# Patient Record
Sex: Female | Born: 1955 | Race: Black or African American | Hispanic: No | Marital: Married | Smoking: Never smoker
Health system: Southern US, Community
[De-identification: ages and names within clinical notes are randomized; demographics above are authoritative.]

## PROBLEM LIST (undated history)

## (undated) DIAGNOSIS — Z8489 Family history of other specified conditions: Secondary | ICD-10-CM

## (undated) DIAGNOSIS — E119 Type 2 diabetes mellitus without complications: Secondary | ICD-10-CM

## (undated) DIAGNOSIS — C3492 Malignant neoplasm of unspecified part of left bronchus or lung: Secondary | ICD-10-CM

## (undated) DIAGNOSIS — M1990 Unspecified osteoarthritis, unspecified site: Secondary | ICD-10-CM

## (undated) DIAGNOSIS — E876 Hypokalemia: Principal | ICD-10-CM

## (undated) DIAGNOSIS — Z973 Presence of spectacles and contact lenses: Secondary | ICD-10-CM

## (undated) HISTORY — DX: Malignant neoplasm of unspecified part of left bronchus or lung: C34.92

## (undated) HISTORY — PX: ABDOMINAL HYSTERECTOMY: SHX81

## (undated) HISTORY — DX: Hypokalemia: E87.6

---

## 2005-09-29 ENCOUNTER — Ambulatory Visit: Payer: Self-pay | Admitting: Family Medicine

## 2006-01-08 ENCOUNTER — Ambulatory Visit: Payer: Self-pay

## 2006-07-15 ENCOUNTER — Emergency Department: Payer: Self-pay | Admitting: Unknown Physician Specialty

## 2007-01-09 ENCOUNTER — Ambulatory Visit: Payer: Self-pay | Admitting: Obstetrics & Gynecology

## 2007-03-04 ENCOUNTER — Ambulatory Visit: Payer: Self-pay | Admitting: Family Medicine

## 2007-04-02 ENCOUNTER — Ambulatory Visit: Payer: Self-pay | Admitting: Family Medicine

## 2007-04-04 ENCOUNTER — Ambulatory Visit: Payer: Self-pay | Admitting: Specialist

## 2007-04-12 ENCOUNTER — Emergency Department: Payer: Self-pay | Admitting: Emergency Medicine

## 2008-04-21 ENCOUNTER — Ambulatory Visit: Payer: Self-pay | Admitting: Otolaryngology

## 2008-07-14 ENCOUNTER — Ambulatory Visit: Payer: Self-pay | Admitting: Family Medicine

## 2008-07-29 ENCOUNTER — Ambulatory Visit: Payer: Self-pay | Admitting: Internal Medicine

## 2008-07-30 ENCOUNTER — Ambulatory Visit: Payer: Self-pay | Admitting: Internal Medicine

## 2008-08-30 ENCOUNTER — Ambulatory Visit: Payer: Self-pay | Admitting: Internal Medicine

## 2010-10-26 ENCOUNTER — Ambulatory Visit: Payer: Self-pay | Admitting: Internal Medicine

## 2012-03-14 ENCOUNTER — Emergency Department: Payer: Self-pay | Admitting: *Deleted

## 2012-03-14 LAB — CBC
HCT: 38.6 % (ref 35.0–47.0)
MCH: 30 pg (ref 26.0–34.0)
MCHC: 34.1 g/dL (ref 32.0–36.0)
MCV: 88 fL (ref 80–100)
RBC: 4.39 10*6/uL (ref 3.80–5.20)

## 2012-03-14 LAB — BASIC METABOLIC PANEL
Anion Gap: 5 — ABNORMAL LOW (ref 7–16)
Co2: 30 mmol/L (ref 21–32)
EGFR (Non-African Amer.): >60
Osmolality: 280 (ref 275–301)

## 2013-05-27 ENCOUNTER — Other Ambulatory Visit: Payer: Self-pay | Admitting: Orthopedic Surgery

## 2013-06-03 ENCOUNTER — Encounter (HOSPITAL_BASED_OUTPATIENT_CLINIC_OR_DEPARTMENT_OTHER): Payer: Self-pay | Admitting: *Deleted

## 2013-06-03 NOTE — Progress Notes (Signed)
To come in for ekg and bmet-no cardiac problems-diabetic-sees a womens dr in [place]

## 2013-06-04 ENCOUNTER — Encounter (HOSPITAL_BASED_OUTPATIENT_CLINIC_OR_DEPARTMENT_OTHER)
Admission: RE | Admit: 2013-06-04 | Discharge: 2013-06-04 | Disposition: A | Discharge status: Home / Self Care | Payer: 59 | Source: Ambulatory Visit | Attending: Orthopedic Surgery | Admitting: Orthopedic Surgery

## 2013-06-04 ENCOUNTER — Other Ambulatory Visit: Payer: Self-pay

## 2013-06-04 LAB — BASIC METABOLIC PANEL
CO2: 28 mEq/L (ref 19–32)
Glucose, Bld: 304 mg/dL — ABNORMAL HIGH (ref 70–99)
Potassium: 4 mEq/L (ref 3.5–5.1)
Sodium: 135 mEq/L (ref 135–145)

## 2013-06-04 NOTE — H&P (Signed)
Terri Marks is an 57 y.o. female.   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  She has had a seven month history of swelling and pain in the radial aspect of her right wrist.  She had no antecedent history of directed trauma, but did have a strain injury when a dog took off and stretched her wrist.  She has had chronic pain unrelieved by activity modification and over-the-counter  analgesics.      Past Medical History  Diagnosis Date  . Diabetes mellitus without complication   . Wears glasses   . Arthritis   . Family history of anesthesia complication     sister hard to put under    Past Surgical History  Procedure Laterality Date  . Abdominal hysterectomy      partial  . Cesarean section      x2    History reviewed. No pertinent family history. Social History:  reports that she has never smoked. She does not have any smokeless tobacco history on file. She reports that she does not drink alcohol or use illicit drugs.  Allergies:  Allergies  Allergen Reactions  . Bactrim (Sulfamethoxazole W-Trimethoprim)     Gi upset  . Bactrim (Sulfamethoxazole-Tmp Ds)     Gi upset  . Flagyl (Metronidazole) Nausea And Vomiting  . Other     omnicef-diarrhia    No prescriptions prior to admission    Results for orders placed during the hospital encounter of 06/05/13 (from the past 48 hour(s))  BASIC METABOLIC PANEL     Status: Abnormal   Collection Time    06/04/13 10:00 AM      Result Value Range   Sodium 135  135 - 145 mEq/L   Potassium 4.0  3.5 - 5.1 mEq/L   Chloride 98  96 - 112 mEq/L   CO2 28  19 - 32 mEq/L   Glucose, Bld 304 (*) 70 - 99 mg/dL   BUN 14  6 - 23 mg/dL   Creatinine, Ser 1.61  0.50 - 1.10 mg/dL   Calcium 9.6  8.4 - 09.6 mg/dL   GFR calc non Af Amer 80 (*) >90 mL/min   GFR calc Af Amer >90  >90 mL/min   Comment:             The eGFR has been calculated     using the CKD EPI equation.     This calculation has not been     validated in all clinical     situations.     eGFR's persistently     <90 mL/min signify     possible Chronic Kidney Disease.    No results found.   Pertinent items are noted in HPI.  Height 5\' 2"  (1.575 m), weight 63.05 kg (139 lb).  General appearance: alert Head: Normocephalic, without obvious abnormality Neck: supple, symmetrical, trachea midline Resp: clear to auscultation bilaterally Cardio: regular rate and rhythm GI: normal findings: bowel sounds normal Extremities:  Inspection of her wrist reveals swelling at her right first dorsal compartment.  She is tender on palpation and has a positive Finkelstein maneuver. She has full range of motion of her fingers and thumb. Pulse and cap refill are intact.  Motor and sensory examination is unremarkable.    Pulses: 2+ and symmetric Skin: normal Neurologic: Grossly normal   Assessment/Plan Impression: DeQuervain's right wrist  Plan: To  the OR for release right 1st dorsal compartment.The procedure, risks,benefits and post-op course were discussed with the patient at length and they were in agreement with the plan.  DASNOIT,Doron Shake J 06/04/2013, 3:06 PM   H&P documentation: 06/05/2013  -History and Physical Reviewed  -Patient has been re-examined  -No change in the plan of care  Wyn Forster, MD

## 2013-06-05 ENCOUNTER — Encounter (HOSPITAL_BASED_OUTPATIENT_CLINIC_OR_DEPARTMENT_OTHER)
Admission: RE | Disposition: A | Discharge status: Home / Self Care | Payer: Self-pay | Source: Ambulatory Visit | Attending: Orthopedic Surgery

## 2013-06-05 ENCOUNTER — Ambulatory Visit (HOSPITAL_BASED_OUTPATIENT_CLINIC_OR_DEPARTMENT_OTHER)
Admission: RE | Admit: 2013-06-05 | Discharge: 2013-06-05 | Disposition: A | Discharge status: Home / Self Care | Payer: 59 | Source: Ambulatory Visit | Attending: Orthopedic Surgery | Admitting: Orthopedic Surgery

## 2013-06-05 ENCOUNTER — Encounter (HOSPITAL_BASED_OUTPATIENT_CLINIC_OR_DEPARTMENT_OTHER): Payer: Self-pay | Admitting: Anesthesiology

## 2013-06-05 ENCOUNTER — Ambulatory Visit (HOSPITAL_BASED_OUTPATIENT_CLINIC_OR_DEPARTMENT_OTHER): Payer: 59 | Admitting: Anesthesiology

## 2013-06-05 DIAGNOSIS — M719 Bursopathy, unspecified: Secondary | ICD-10-CM | POA: Insufficient documentation

## 2013-06-05 DIAGNOSIS — Z79899 Other long term (current) drug therapy: Secondary | ICD-10-CM | POA: Insufficient documentation

## 2013-06-05 DIAGNOSIS — M654 Radial styloid tenosynovitis [de Quervain]: Secondary | ICD-10-CM | POA: Insufficient documentation

## 2013-06-05 DIAGNOSIS — M6790 Unspecified disorder of synovium and tendon, unspecified site: Secondary | ICD-10-CM | POA: Insufficient documentation

## 2013-06-05 DIAGNOSIS — E119 Type 2 diabetes mellitus without complications: Secondary | ICD-10-CM | POA: Insufficient documentation

## 2013-06-05 HISTORY — DX: Unspecified osteoarthritis, unspecified site: M19.90

## 2013-06-05 HISTORY — DX: Family history of other specified conditions: Z84.89

## 2013-06-05 HISTORY — DX: Presence of spectacles and contact lenses: Z97.3

## 2013-06-05 HISTORY — PX: DORSAL COMPARTMENT RELEASE: SHX5039

## 2013-06-05 HISTORY — DX: Type 2 diabetes mellitus without complications: E11.9

## 2013-06-05 LAB — GLUCOSE, CAPILLARY: Glucose-Capillary: 230 mg/dL — ABNORMAL HIGH (ref 70–99)

## 2013-06-05 SURGERY — RELEASE, FIRST DORSAL COMPARTMENT, HAND
Anesthesia: Monitor Anesthesia Care | Site: Wrist | Laterality: Right | Wound class: Clean

## 2013-06-05 MED ORDER — ONDANSETRON HCL 4 MG/2ML IJ SOLN
INTRAMUSCULAR | Status: DC | PRN
  Administered 2013-06-05: 4 mg via INTRAVENOUS

## 2013-06-05 MED ORDER — PROPOFOL INFUSION 10 MG/ML OPTIME
INTRAVENOUS | Status: DC | PRN
  Administered 2013-06-05: 120 ug/kg/min via INTRAVENOUS

## 2013-06-05 MED ORDER — CHLORHEXIDINE GLUCONATE 4 % EX LIQD: 60.0000 mL | Freq: Once | CUTANEOUS | Status: DC

## 2013-06-05 MED ORDER — LIDOCAINE HCL (CARDIAC) 20 MG/ML IV SOLN
INTRAVENOUS | Status: DC | PRN
  Administered 2013-06-05: 50 mg via INTRAVENOUS

## 2013-06-05 MED ORDER — LACTATED RINGERS IV SOLN
INTRAVENOUS | Status: DC
  Administered 2013-06-05: 11:00:00 via INTRAVENOUS

## 2013-06-05 MED ORDER — OXYCODONE-ACETAMINOPHEN 5-325 MG PO TABS: ORAL_TABLET | ORAL | Status: DC

## 2013-06-05 MED ORDER — FENTANYL CITRATE 0.05 MG/ML IJ SOLN
INTRAMUSCULAR | Status: DC | PRN
  Administered 2013-06-05 (×2): 50 ug via INTRAVENOUS

## 2013-06-05 MED ORDER — LIDOCAINE HCL 2 % IJ SOLN
INTRAMUSCULAR | Status: DC | PRN
  Administered 2013-06-05: 2.5 mL

## 2013-06-05 MED ORDER — ONDANSETRON HCL 4 MG/2ML IJ SOLN: 4.0000 mg | Freq: Once | INTRAMUSCULAR | Status: DC | PRN

## 2013-06-05 MED ORDER — HYDROMORPHONE HCL PF 1 MG/ML IJ SOLN
0.2500 mg | INTRAMUSCULAR | Status: DC | PRN
Start: 2013-06-05 — End: 2013-06-05

## 2013-06-05 MED ORDER — FENTANYL CITRATE 0.05 MG/ML IJ SOLN: 50.0000 ug | INTRAMUSCULAR | Status: DC | PRN

## 2013-06-05 MED ORDER — MIDAZOLAM HCL 2 MG/2ML IJ SOLN: 1.0000 mg | INTRAMUSCULAR | Status: DC | PRN

## 2013-06-05 MED ORDER — MIDAZOLAM HCL 5 MG/5ML IJ SOLN
INTRAMUSCULAR | Status: DC | PRN
  Administered 2013-06-05 (×2): 1 mg via INTRAVENOUS

## 2013-06-05 MED ORDER — OXYCODONE HCL 5 MG/5ML PO SOLN: 5.0000 mg | Freq: Once | ORAL | Status: AC | PRN

## 2013-06-05 MED ORDER — OXYCODONE HCL 5 MG PO TABS
5.0000 mg | ORAL_TABLET | Freq: Once | ORAL | Status: AC | PRN
  Administered 2013-06-05: 5 mg via ORAL

## 2013-06-05 SURGICAL SUPPLY — 40 items
BANDAGE ELASTIC 3 VELCRO ST LF (GAUZE/BANDAGES/DRESSINGS) ×1 IMPLANT
BLADE MINI RND TIP GREEN BEAV (BLADE) IMPLANT
BLADE SURG 15 STRL LF DISP TIS (BLADE) ×1 IMPLANT
BLADE SURG 15 STRL SS (BLADE) ×2
BNDG CMPR 9X4 STRL LF SNTH (GAUZE/BANDAGES/DRESSINGS) ×1
BNDG ESMARK 4X9 LF (GAUZE/BANDAGES/DRESSINGS) ×1 IMPLANT
BRUSH SCRUB EZ PLAIN DRY (MISCELLANEOUS) ×2 IMPLANT
CLOTH BEACON ORANGE TIMEOUT ST (SAFETY) ×2 IMPLANT
CORDS BIPOLAR (ELECTRODE) IMPLANT
COVER MAYO STAND STRL (DRAPES) ×2 IMPLANT
COVER TABLE BACK 60X90 (DRAPES) ×2 IMPLANT
CUFF TOURNIQUET SINGLE 18IN (TOURNIQUET CUFF) ×1 IMPLANT
DECANTER SPIKE VIAL GLASS SM (MISCELLANEOUS) IMPLANT
DRAPE EXTREMITY T 121X128X90 (DRAPE) ×2 IMPLANT
DRAPE SURG 17X23 STRL (DRAPES) ×2 IMPLANT
DRSG TEGADERM 4X4.75 (GAUZE/BANDAGES/DRESSINGS) IMPLANT
GLOVE BIO SURGEON STRL SZ7 (GLOVE) ×1 IMPLANT
GLOVE BIOGEL M STRL SZ7.5 (GLOVE) ×2 IMPLANT
GLOVE BIOGEL PI IND STRL 7.5 (GLOVE) IMPLANT
GLOVE BIOGEL PI INDICATOR 7.5 (GLOVE) ×1
GLOVE ORTHO TXT STRL SZ7.5 (GLOVE) ×2 IMPLANT
GOWN BRE IMP PREV XXLGXLNG (GOWN DISPOSABLE) ×4 IMPLANT
GOWN PREVENTION PLUS XLARGE (GOWN DISPOSABLE) ×2 IMPLANT
NEEDLE 27GAX1X1/2 (NEEDLE) ×1 IMPLANT
PACK BASIN DAY SURGERY FS (CUSTOM PROCEDURE TRAY) ×2 IMPLANT
PAD CAST 3X4 CTTN HI CHSV (CAST SUPPLIES) IMPLANT
PADDING CAST ABS 4INX4YD NS (CAST SUPPLIES) ×1
PADDING CAST ABS COTTON 4X4 ST (CAST SUPPLIES) ×1 IMPLANT
PADDING CAST COTTON 3X4 STRL (CAST SUPPLIES)
SLEEVE SCD COMPRESS KNEE MED (MISCELLANEOUS) IMPLANT
SPONGE GAUZE 4X4 12PLY (GAUZE/BANDAGES/DRESSINGS) ×2 IMPLANT
STOCKINETTE 4X48 STRL (DRAPES) ×2 IMPLANT
STRIP CLOSURE SKIN 1/2X4 (GAUZE/BANDAGES/DRESSINGS) ×2 IMPLANT
SUT PROLENE 3 0 PS 2 (SUTURE) ×2 IMPLANT
SUT VIC AB 4-0 P-3 18XBRD (SUTURE) IMPLANT
SUT VIC AB 4-0 P3 18 (SUTURE)
SYR 3ML 23GX1 SAFETY (SYRINGE) IMPLANT
SYR CONTROL 10ML LL (SYRINGE) ×1 IMPLANT
TRAY DSU PREP LF (CUSTOM PROCEDURE TRAY) ×2 IMPLANT
UNDERPAD 30X30 INCONTINENT (UNDERPADS AND DIAPERS) ×2 IMPLANT

## 2013-06-05 NOTE — Transfer of Care (Signed)
Immediate Anesthesia Transfer of Care Note  Patient: Terri Marks  Procedure(s) Performed: Procedure(s) (LRB): RELEASE RIGHT FIRST DORSAL COMPARTMENT (DEQUERVAIN) (Right)  Patient Location: PACU  Anesthesia Type: MAC  Level of Consciousness: awake, sedated, patient cooperative and responds to stimulation  Airway & Oxygen Therapy: Patient Spontanous Breathing and Patient connected to face mask oxygen  Post-op Assessment: Report given to PACU RN, Post -op Vital signs reviewed and stable and Patient moving all extremities  Post vital signs: Reviewed and stable  Complications: No apparent anesthesia complications

## 2013-06-05 NOTE — Anesthesia Postprocedure Evaluation (Signed)
  Anesthesia Post-op Note  Patient: Terri Marks  Procedure(s) Performed: Procedure(s): RELEASE RIGHT FIRST DORSAL COMPARTMENT (DEQUERVAIN) (Right)  Patient Location: PACU  Anesthesia Type:General  Level of Consciousness: awake, alert  and oriented  Airway and Oxygen Therapy: Patient Spontanous Breathing  Post-op Pain: mild  Post-op Assessment: Post-op Vital signs reviewed  Post-op Vital Signs: Reviewed  Complications: No apparent anesthesia complications

## 2013-06-05 NOTE — Op Note (Signed)
[id number] 

## 2013-06-05 NOTE — Brief Op Note (Signed)
06/05/2013  11:23 AM  PATIENT:  Daylene Posey  57 y.o. female  PRE-OPERATIVE DIAGNOSIS:  RIGHT DEQUERVAIN SYNDROME  POST-OPERATIVE DIAGNOSIS:  RIGHT DEQUERVAIN SYNDROME  PROCEDURE:  Procedure(s): RELEASE RIGHT FIRST DORSAL COMPARTMENT (DEQUERVAIN) (Right)  SURGEON:  Surgeon(s) and Role:    * Wyn Forster., MD - Primary  PHYSICIAN ASSISTANT:   ASSISTANTS: Mallory Shirk.A-C   ANESTHESIA:   none  EBL:     BLOOD ADMINISTERED:none  DRAINS: none   LOCAL MEDICATIONS USED:  LIDOCAINE   SPECIMEN:  No Specimen  DISPOSITION OF SPECIMEN:  N/A  COUNTS:  YES  TOURNIQUET:    DICTATION: .Other Dictation: Dictation Number [id number]  PLAN OF CARE: Discharge to home after PACU  PATIENT DISPOSITION:  PACU - hemodynamically stable.   Delay start of Pharmacological VTE agent (>24hrs) due to surgical blood loss or risk of bleeding: not applicable

## 2013-06-05 NOTE — Anesthesia Preprocedure Evaluation (Addendum)
Anesthesia Evaluation  Patient identified by MRN, date of birth, ID band Patient awake    Reviewed: Allergy & Precautions, H&P , NPO status , Patient's Chart, lab work & pertinent test results  Airway Mallampati: I TM Distance: >3 FB Neck ROM: Full    Dental  (+) Teeth Intact and Dental Advisory Given   Pulmonary  breath sounds clear to auscultation        Cardiovascular Rhythm:Regular Rate:Normal     Neuro/Psych    GI/Hepatic   Endo/Other  diabetes, Poorly Controlled, Type 2, Oral Hypoglycemic Agents  Renal/GU      Musculoskeletal   Abdominal   Peds  Hematology   Anesthesia Other Findings   Reproductive/Obstetrics                           Anesthesia Physical Anesthesia Plan  ASA: III  Anesthesia Plan: MAC   Post-op Pain Management:    Induction: Intravenous  Airway Management Planned: Simple Face Mask  Additional Equipment:   Intra-op Plan:   Post-operative Plan:   Informed Consent: I have reviewed the patients History and Physical, chart, labs and discussed the procedure including the risks, benefits and alternatives for the proposed anesthesia with the patient or authorized representative who has indicated his/her understanding and acceptance.   Dental advisory given  Plan Discussed with: CRNA and Anesthesiologist  Anesthesia Plan Comments:         Anesthesia Quick Evaluation

## 2013-06-06 ENCOUNTER — Encounter (HOSPITAL_BASED_OUTPATIENT_CLINIC_OR_DEPARTMENT_OTHER): Payer: Self-pay | Admitting: Orthopedic Surgery

## 2013-06-06 NOTE — Op Note (Signed)
Terri, Marks               ACCOUNT NO.:  [id number]  MEDICAL RECORD NO.:  [id number]  LOCATION:                               FACILITY:  [facility]  PHYSICIAN:  Katy Fitch. Lothar Prehn, M.D. 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  OPERATIONS:  Release of first dorsal compartment, right wrist, and resection of septum in compartment.  OPERATING SURGEON:  Katy Fitch. Dayana Dalporto, MD  ASSISTANT:  Marveen Reeks Dasnoit, PA  ANESTHESIA:  2% lidocaine, radial superficial branch block and first dorsal compartment block, right wrist, supplemented by IV sedation.  SUPERVISING ANESTHESIOLOGIST:  Sheldon Silvan, M.D.  INDICATIONS:  Terri Marks is a 57 year old woman, employed as a Chief Strategy Officer at [employer].  She has a history of 8 months of pain on the radial aspect of her right wrist.  She has not responded to splinting and activity modification.  She has diabetes.  She had a very large bump at the first dorsal compartment.  We recommended release of first dorsal compartment.  Preoperatively, she was noted to have radial superficial sensory branch symptoms.  After informed consent, she was brought to the operating room at this time.  DESCRIPTION OF PROCEDURE:  Terri Marks was interviewed by Dr. Ivin Booty of Anesthesia and was advised to consider sedation and local anesthesia.  Questions regarding her anticipated right wrist surgery were invited and answered in detail followed by marking of her right wrist as the proper surgical site with a marking pen per the identification protocol.  She was transferred to room 6 of the [facility] where under Dr.  Ivin Booty' direct supervision, IV sedation was provided.  The right hand and wrist were then prepped with Betadine soap and solution and sterilely draped.  A pneumatic tourniquet was applied to the proximal right brachium.  A 2% lidocaine was infiltrated along the radial aspect of the wrist proximal to the first dorsal compartment, creating a block of the radial superficial sensory branches and the compartment was blocked directly with an injection.  After 5 minutes, excellent anesthesia was achieved.  Following exsanguination of the right arm with an Esmarch bandage, an arterial tourniquet on the proximal brachium was inflated at 220 mmHg.  Following routine surgical time-out, a short transverse incision was fashioned, exposing the first dorsal compartment region.  The compartment was invested in inflammatory tissue.  The most dorsal radial superficial sensory branch was adherent and obscured by the inflammatory tissue.  This placed this branch at significant risk for surgical injury.  This was gently dissected with tenotomy scissors and a micro Freer.  It was then gently retracted.  The compartment had a significant wall thickening of 3 mm.  It was released at its apex, identifying a single slip of the abductor pollicis longus.  There was a very large slip of the extensor pollicis brevis which upon traction extended the MP and interphalangeal joint.  A septum was noted  between the abductor pollicis longus tendon slip and the extensor pollicis brevis.  The septum was incised and subsequently removed with a rongeur.  Thereafter, free range of motion of the CMC, MP, and IP joints was noted.  The wound was inspected for bleeding points followed by repair of the skin with intradermal 3-0 Prolene and Steri-Strips.  For aftercare, Terri Marks was provided prescription for Percocet 5 mg 1 p.o. q.4 to 6 hours p.r.n. pain, 20 tabs, without refill.  We will see her back in followup in our  office for recheck in 1 week.     Katy Fitch Mihcael Ledee, M.D.     RVS/MEDQ  D:  06/05/2013  T:  06/06/2013  Job:  [id number]  cc:   Estell Harpin, M.D.

## 2014-01-16 ENCOUNTER — Ambulatory Visit: Payer: Self-pay

## 2014-01-28 ENCOUNTER — Ambulatory Visit: Payer: Self-pay

## 2014-02-27 ENCOUNTER — Ambulatory Visit: Payer: Self-pay

## 2014-03-26 ENCOUNTER — Ambulatory Visit: Payer: Self-pay | Admitting: Family Medicine

## 2014-03-26 ENCOUNTER — Ambulatory Visit: Payer: Self-pay | Admitting: Internal Medicine

## 2014-03-31 ENCOUNTER — Ambulatory Visit: Payer: Self-pay | Admitting: Internal Medicine

## 2014-03-31 LAB — COMPREHENSIVE METABOLIC PANEL
ALBUMIN: 3.7 g/dL (ref 3.4–5.0)
ANION GAP: 7 (ref 7–16)
Alkaline Phosphatase: 125 U/L — ABNORMAL HIGH
BILIRUBIN TOTAL: 0.3 mg/dL (ref 0.2–1.0)
BUN: 10 mg/dL (ref 7–18)
CALCIUM: 9.1 mg/dL (ref 8.5–10.1)
CHLORIDE: 103 mmol/L (ref 98–107)
CO2: 32 mmol/L (ref 21–32)
CREATININE: 0.81 mg/dL (ref 0.60–1.30)
EGFR (Non-African Amer.): >60
GLUCOSE: 165 mg/dL — AB (ref 65–99)
OSMOLALITY: 286 (ref 275–301)
Potassium: 3.6 mmol/L (ref 3.5–5.1)
SGOT(AST): 6 U/L — ABNORMAL LOW (ref 15–37)
SGPT (ALT): 14 U/L (ref 12–78)
Sodium: 142 mmol/L (ref 136–145)
Total Protein: 7.9 g/dL (ref 6.4–8.2)

## 2014-03-31 LAB — CBC CANCER CENTER
BASOS ABS: 0 x10 3/mm (ref 0.0–0.1)
BASOS PCT: 0.4 %
Eosinophil #: 0.1 x10 3/mm (ref 0.0–0.7)
Eosinophil %: 2.1 %
HCT: 40.1 % (ref 35.0–47.0)
HGB: 13.3 g/dL (ref 12.0–16.0)
LYMPHS ABS: 1.4 x10 3/mm (ref 1.0–3.6)
Lymphocyte %: 36.6 %
MCH: 28.7 pg (ref 26.0–34.0)
MCHC: 33.1 g/dL (ref 32.0–36.0)
MCV: 87 fL (ref 80–100)
Monocyte #: 0.3 x10 3/mm (ref 0.2–0.9)
Monocyte %: 8.3 %
Neutrophil #: 2 x10 3/mm (ref 1.4–6.5)
Neutrophil %: 52.6 %
Platelet: 208 x10 3/mm (ref 150–440)
RBC: 4.62 10*6/uL (ref 3.80–5.20)
RDW: 12.8 % (ref 11.5–14.5)
WBC: 3.8 x10 3/mm (ref 3.6–11.0)

## 2014-03-31 LAB — APTT: ACTIVATED PTT: 28.6 s (ref 23.6–35.9)

## 2014-03-31 LAB — PROTIME-INR
INR: 0.9
Prothrombin Time: 12.1 secs (ref 11.5–14.7)

## 2014-04-02 LAB — PROT IMMUNOELECTROPHORES(ARMC)

## 2014-04-08 LAB — URINE IEP, RANDOM

## 2014-04-16 ENCOUNTER — Ambulatory Visit: Payer: Self-pay | Admitting: Internal Medicine

## 2014-04-24 LAB — CREATININE, SERUM
Creatinine: 0.97 mg/dL (ref 0.60–1.30)
EGFR (African American): >60
EGFR (Non-African Amer.): >60

## 2014-04-24 LAB — CALCIUM: Calcium, Total: 9.5 mg/dL (ref 8.5–10.1)

## 2014-04-27 ENCOUNTER — Ambulatory Visit: Payer: Self-pay | Admitting: Internal Medicine

## 2014-04-29 ENCOUNTER — Ambulatory Visit: Payer: Self-pay | Admitting: Internal Medicine

## 2014-05-12 LAB — CBC CANCER CENTER
BASOS ABS: 0 x10 3/mm (ref 0.0–0.1)
BASOS PCT: 0.1 %
EOS PCT: 0 %
Eosinophil #: 0 x10 3/mm (ref 0.0–0.7)
HCT: 39 % (ref 35.0–47.0)
HGB: 13.1 g/dL (ref 12.0–16.0)
LYMPHS ABS: 0.6 x10 3/mm — AB (ref 1.0–3.6)
Lymphocyte %: 7 %
MCH: 28.7 pg (ref 26.0–34.0)
MCHC: 33.5 g/dL (ref 32.0–36.0)
MCV: 86 fL (ref 80–100)
MONOS PCT: 0.8 %
Monocyte #: 0.1 x10 3/mm — ABNORMAL LOW (ref 0.2–0.9)
NEUTROS ABS: 7.5 x10 3/mm — AB (ref 1.4–6.5)
NEUTROS PCT: 92.1 %
Platelet: 262 x10 3/mm (ref 150–440)
RBC: 4.55 10*6/uL (ref 3.80–5.20)
RDW: 13.3 % (ref 11.5–14.5)
WBC: 8.1 x10 3/mm (ref 3.6–11.0)

## 2014-05-12 LAB — BASIC METABOLIC PANEL
Anion Gap: 13 (ref 7–16)
BUN: 16 mg/dL (ref 7–18)
CALCIUM: 9.6 mg/dL (ref 8.5–10.1)
CO2: 26 mmol/L (ref 21–32)
Chloride: 99 mmol/L (ref 98–107)
Creatinine: 1.01 mg/dL (ref 0.60–1.30)
Glucose: 358 mg/dL — ABNORMAL HIGH (ref 65–99)
Osmolality: 291 (ref 275–301)
POTASSIUM: 4.3 mmol/L (ref 3.5–5.1)
Sodium: 138 mmol/L (ref 136–145)

## 2014-05-19 LAB — CBC CANCER CENTER
BASOS ABS: 0.1 x10 3/mm (ref 0.0–0.1)
BASOS PCT: 0.4 %
EOS PCT: 0.6 %
Eosinophil #: 0.1 x10 3/mm (ref 0.0–0.7)
HCT: 44.5 % (ref 35.0–47.0)
HGB: 14.4 g/dL (ref 12.0–16.0)
Lymphocyte #: 2.5 x10 3/mm (ref 1.0–3.6)
Lymphocyte %: 18 %
MCH: 27.9 pg (ref 26.0–34.0)
MCHC: 32.4 g/dL (ref 32.0–36.0)
MCV: 86 fL (ref 80–100)
Monocyte #: 1.3 x10 3/mm — ABNORMAL HIGH (ref 0.2–0.9)
Monocyte %: 9.2 %
Neutrophil #: 9.8 x10 3/mm — ABNORMAL HIGH (ref 1.4–6.5)
Neutrophil %: 71.8 %
Platelet: 196 x10 3/mm (ref 150–440)
RBC: 5.18 10*6/uL (ref 3.80–5.20)
RDW: 12.6 % (ref 11.5–14.5)
WBC: 13.7 x10 3/mm — ABNORMAL HIGH (ref 3.6–11.0)

## 2014-05-22 LAB — PATHOLOGY REPORT

## 2014-05-26 LAB — HEPATIC FUNCTION PANEL A (ARMC)
ALK PHOS: 149 U/L — AB
Albumin: 3.4 g/dL (ref 3.4–5.0)
BILIRUBIN TOTAL: 0.2 mg/dL (ref 0.2–1.0)
Bilirubin, Direct: <0.1 mg/dL (ref 0.00–0.20)
SGOT(AST): 11 U/L — ABNORMAL LOW (ref 15–37)
SGPT (ALT): 21 U/L
Total Protein: 7.1 g/dL (ref 6.4–8.2)

## 2014-05-26 LAB — CBC CANCER CENTER
BASOS ABS: 0.1 x10 3/mm (ref 0.0–0.1)
Basophil %: 0.5 %
EOS ABS: 0.1 x10 3/mm (ref 0.0–0.7)
Eosinophil %: 1.2 %
HCT: 38.8 % (ref 35.0–47.0)
HGB: 12.9 g/dL (ref 12.0–16.0)
LYMPHS ABS: 1.8 x10 3/mm (ref 1.0–3.6)
LYMPHS PCT: 18.1 %
MCH: 28.5 pg (ref 26.0–34.0)
MCHC: 33.3 g/dL (ref 32.0–36.0)
MCV: 86 fL (ref 80–100)
MONO ABS: 0.7 x10 3/mm (ref 0.2–0.9)
MONOS PCT: 6.5 %
Neutrophil #: 7.4 x10 3/mm — ABNORMAL HIGH (ref 1.4–6.5)
Neutrophil %: 73.7 %
Platelet: 146 x10 3/mm — ABNORMAL LOW (ref 150–440)
RBC: 4.53 10*6/uL (ref 3.80–5.20)
RDW: 12.7 % (ref 11.5–14.5)
WBC: 10.1 x10 3/mm (ref 3.6–11.0)

## 2014-05-26 LAB — CREATININE, SERUM
Creatinine: 0.93 mg/dL (ref 0.60–1.30)
EGFR (African American): >60

## 2014-05-28 ENCOUNTER — Ambulatory Visit: Payer: Self-pay | Admitting: Vascular Surgery

## 2014-05-30 ENCOUNTER — Ambulatory Visit: Payer: Self-pay | Admitting: Internal Medicine

## 2014-06-02 LAB — BASIC METABOLIC PANEL
Anion Gap: 9 (ref 7–16)
BUN: 16 mg/dL (ref 7–18)
CALCIUM: 9.1 mg/dL (ref 8.5–10.1)
CO2: 25 mmol/L (ref 21–32)
Chloride: 102 mmol/L (ref 98–107)
Creatinine: 0.9 mg/dL (ref 0.60–1.30)
EGFR (African American): >60
Glucose: 415 mg/dL — ABNORMAL HIGH (ref 65–99)
OSMOLALITY: 291 (ref 275–301)
Potassium: 4.4 mmol/L (ref 3.5–5.1)
SODIUM: 136 mmol/L (ref 136–145)

## 2014-06-02 LAB — CBC CANCER CENTER
BASOS ABS: 0 x10 3/mm (ref 0.0–0.1)
BASOS PCT: 0.2 %
EOS PCT: 0.1 %
Eosinophil #: 0 x10 3/mm (ref 0.0–0.7)
HCT: 37.7 % (ref 35.0–47.0)
HGB: 12.5 g/dL (ref 12.0–16.0)
LYMPHS PCT: 9.8 %
Lymphocyte #: 0.8 x10 3/mm — ABNORMAL LOW (ref 1.0–3.6)
MCH: 28.8 pg (ref 26.0–34.0)
MCHC: 33.1 g/dL (ref 32.0–36.0)
MCV: 87 fL (ref 80–100)
MONO ABS: 0.1 x10 3/mm — AB (ref 0.2–0.9)
MONOS PCT: 0.9 %
Neutrophil #: 7.6 x10 3/mm — ABNORMAL HIGH (ref 1.4–6.5)
Neutrophil %: 89 %
PLATELETS: 240 x10 3/mm (ref 150–440)
RBC: 4.34 10*6/uL (ref 3.80–5.20)
RDW: 13.6 % (ref 11.5–14.5)
WBC: 8.5 x10 3/mm (ref 3.6–11.0)

## 2014-06-09 LAB — CBC CANCER CENTER
Basophil #: 0.1 "x10 3/mm "
Basophil %: 0.6 %
Eosinophil #: 0.1 "x10 3/mm "
Eosinophil %: 1.1 %
HCT: 38.5 %
HGB: 12.8 g/dL
Lymphocyte %: 17.3 %
Lymphs Abs: 2.2 "x10 3/mm "
MCH: 28.7 pg
MCHC: 33.2 g/dL
MCV: 87 fL
Monocyte #: 0.7 "x10 3/mm "
Monocyte %: 5.6 %
Neutrophil #: 9.4 "x10 3/mm " — ABNORMAL HIGH
Neutrophil %: 75.4 %
Platelet: 119 "x10 3/mm " — ABNORMAL LOW
RBC: 4.46 "x10 6/mm "
RDW: 13.5 %
WBC: 12.5 "x10 3/mm " — ABNORMAL HIGH

## 2014-06-09 LAB — CREATININE, SERUM
CREATININE: 1.15 mg/dL (ref 0.60–1.30)
EGFR (African American): >60
GFR CALC NON AF AMER: 53 — AB

## 2014-06-16 LAB — CBC CANCER CENTER
BASOS ABS: 0 x10 3/mm (ref 0.0–0.1)
BASOS PCT: 0.4 %
Eosinophil #: 0.2 x10 3/mm (ref 0.0–0.7)
Eosinophil %: 1.7 %
HCT: 36 % (ref 35.0–47.0)
HGB: 12 g/dL (ref 12.0–16.0)
LYMPHS ABS: 2 x10 3/mm (ref 1.0–3.6)
Lymphocyte %: 22.5 %
MCH: 28.8 pg (ref 26.0–34.0)
MCHC: 33.3 g/dL (ref 32.0–36.0)
MCV: 87 fL (ref 80–100)
MONO ABS: 0.5 x10 3/mm (ref 0.2–0.9)
Monocyte %: 5.2 %
NEUTROS ABS: 6.3 x10 3/mm (ref 1.4–6.5)
NEUTROS PCT: 70.2 %
PLATELETS: 111 x10 3/mm — AB (ref 150–440)
RBC: 4.16 10*6/uL (ref 3.80–5.20)
RDW: 14.3 % (ref 11.5–14.5)
WBC: 8.9 x10 3/mm (ref 3.6–11.0)

## 2014-06-23 LAB — CBC CANCER CENTER
BASOS ABS: 0 x10 3/mm (ref 0.0–0.1)
Basophil %: 0.5 %
Eosinophil #: 0 x10 3/mm (ref 0.0–0.7)
Eosinophil %: 0.7 %
HCT: 35.2 % (ref 35.0–47.0)
HGB: 11.6 g/dL — AB (ref 12.0–16.0)
LYMPHS ABS: 1.2 x10 3/mm (ref 1.0–3.6)
Lymphocyte %: 29.6 %
MCH: 28.9 pg (ref 26.0–34.0)
MCHC: 32.9 g/dL (ref 32.0–36.0)
MCV: 88 fL (ref 80–100)
MONOS PCT: 5.1 %
Monocyte #: 0.2 x10 3/mm (ref 0.2–0.9)
NEUTROS PCT: 64.1 %
Neutrophil #: 2.7 x10 3/mm (ref 1.4–6.5)
Platelet: 182 x10 3/mm (ref 150–440)
RBC: 4 10*6/uL (ref 3.80–5.20)
RDW: 15.7 % — AB (ref 11.5–14.5)
WBC: 4.2 x10 3/mm (ref 3.6–11.0)

## 2014-06-23 LAB — HEPATIC FUNCTION PANEL A (ARMC)
ALBUMIN: 3.6 g/dL (ref 3.4–5.0)
ALK PHOS: 103 U/L
AST: 10 U/L — AB (ref 15–37)
BILIRUBIN TOTAL: 0.5 mg/dL (ref 0.2–1.0)
Bilirubin, Direct: 0.1 mg/dL (ref 0.00–0.20)
SGPT (ALT): 18 U/L
TOTAL PROTEIN: 7.2 g/dL (ref 6.4–8.2)

## 2014-06-23 LAB — BASIC METABOLIC PANEL
ANION GAP: 9 (ref 7–16)
BUN: 7 mg/dL (ref 7–18)
CHLORIDE: 104 mmol/L (ref 98–107)
CO2: 27 mmol/L (ref 21–32)
Calcium, Total: 8.7 mg/dL (ref 8.5–10.1)
Creatinine: 0.86 mg/dL (ref 0.60–1.30)
GLUCOSE: 216 mg/dL — AB (ref 65–99)
Osmolality: 284 (ref 275–301)
POTASSIUM: 3.9 mmol/L (ref 3.5–5.1)
Sodium: 140 mmol/L (ref 136–145)

## 2014-06-30 ENCOUNTER — Ambulatory Visit: Payer: Self-pay | Admitting: Internal Medicine

## 2014-06-30 LAB — CBC CANCER CENTER
BASOS ABS: 0 x10 3/mm (ref 0.0–0.1)
Basophil %: 0.5 %
EOS ABS: 0 x10 3/mm (ref 0.0–0.7)
Eosinophil %: 0.4 %
HCT: 38.4 % (ref 35.0–47.0)
HGB: 12.8 g/dL (ref 12.0–16.0)
LYMPHS ABS: 1.6 x10 3/mm (ref 1.0–3.6)
Lymphocyte %: 25.7 %
MCH: 29.3 pg (ref 26.0–34.0)
MCHC: 33.3 g/dL (ref 32.0–36.0)
MCV: 88 fL (ref 80–100)
Monocyte #: 0.4 x10 3/mm (ref 0.2–0.9)
Monocyte %: 6.4 %
NEUTROS ABS: 4.2 x10 3/mm (ref 1.4–6.5)
Neutrophil %: 67 %
PLATELETS: 132 x10 3/mm — AB (ref 150–440)
RBC: 4.36 10*6/uL (ref 3.80–5.20)
RDW: 15.5 % — AB (ref 11.5–14.5)
WBC: 6.3 x10 3/mm (ref 3.6–11.0)

## 2014-06-30 LAB — CREATININE, SERUM
CREATININE: 1.13 mg/dL (ref 0.60–1.30)
GFR CALC NON AF AMER: 54 — AB

## 2014-07-07 LAB — CBC CANCER CENTER
Basophil #: 0 x10 3/mm (ref 0.0–0.1)
Basophil %: 0.3 %
EOS ABS: 0.1 x10 3/mm (ref 0.0–0.7)
Eosinophil %: 0.8 %
HCT: 36.5 % (ref 35.0–47.0)
HGB: 12 g/dL (ref 12.0–16.0)
Lymphocyte #: 1.9 x10 3/mm (ref 1.0–3.6)
Lymphocyte %: 24.2 %
MCH: 29.4 pg (ref 26.0–34.0)
MCHC: 33 g/dL (ref 32.0–36.0)
MCV: 89 fL (ref 80–100)
MONO ABS: 0.5 x10 3/mm (ref 0.2–0.9)
MONOS PCT: 6.5 %
NEUTROS ABS: 5.3 x10 3/mm (ref 1.4–6.5)
Neutrophil %: 68.2 %
PLATELETS: 163 x10 3/mm (ref 150–440)
RBC: 4.09 10*6/uL (ref 3.80–5.20)
RDW: 16.6 % — ABNORMAL HIGH (ref 11.5–14.5)
WBC: 7.7 x10 3/mm (ref 3.6–11.0)

## 2014-07-14 LAB — HEPATIC FUNCTION PANEL A (ARMC)
ALK PHOS: 94 U/L
ALT: 35 U/L
Albumin: 3.8 g/dL (ref 3.4–5.0)
BILIRUBIN DIRECT: 0.1 mg/dL (ref 0.00–0.20)
BILIRUBIN TOTAL: 0.6 mg/dL (ref 0.2–1.0)
SGOT(AST): 25 U/L (ref 15–37)
TOTAL PROTEIN: 7.6 g/dL (ref 6.4–8.2)

## 2014-07-14 LAB — BASIC METABOLIC PANEL
ANION GAP: 10 (ref 7–16)
BUN: 8 mg/dL (ref 7–18)
CO2: 25 mmol/L (ref 21–32)
CREATININE: 0.9 mg/dL (ref 0.60–1.30)
Calcium, Total: 9.5 mg/dL (ref 8.5–10.1)
Chloride: 102 mmol/L (ref 98–107)
GLUCOSE: 303 mg/dL — AB (ref 65–99)
OSMOLALITY: 284 (ref 275–301)
Potassium: 4.1 mmol/L (ref 3.5–5.1)
SODIUM: 137 mmol/L (ref 136–145)

## 2014-07-14 LAB — CBC CANCER CENTER
BASOS ABS: 0 x10 3/mm (ref 0.0–0.1)
BASOS PCT: 0.2 %
EOS PCT: 0 %
Eosinophil #: 0 x10 3/mm (ref 0.0–0.7)
HCT: 36.5 % (ref 35.0–47.0)
HGB: 12.1 g/dL (ref 12.0–16.0)
LYMPHS ABS: 0.6 x10 3/mm — AB (ref 1.0–3.6)
LYMPHS PCT: 12.8 %
MCH: 29.6 pg (ref 26.0–34.0)
MCHC: 33 g/dL (ref 32.0–36.0)
MCV: 90 fL (ref 80–100)
MONO ABS: 0.1 x10 3/mm — AB (ref 0.2–0.9)
Monocyte %: 1.8 %
NEUTROS ABS: 3.8 x10 3/mm (ref 1.4–6.5)
Neutrophil %: 85.2 %
Platelet: 210 x10 3/mm (ref 150–440)
RBC: 4.08 10*6/uL (ref 3.80–5.20)
RDW: 17.3 % — AB (ref 11.5–14.5)
WBC: 4.4 x10 3/mm (ref 3.6–11.0)

## 2014-07-21 LAB — CBC CANCER CENTER
Basophil #: 0 x10 3/mm (ref 0.0–0.1)
Basophil %: 0.3 %
EOS ABS: 0 x10 3/mm (ref 0.0–0.7)
EOS PCT: 0 %
HCT: 37.8 % (ref 35.0–47.0)
HGB: 12.3 g/dL (ref 12.0–16.0)
Lymphocyte #: 0.4 x10 3/mm — ABNORMAL LOW (ref 1.0–3.6)
Lymphocyte %: 9.2 %
MCH: 30.1 pg (ref 26.0–34.0)
MCHC: 32.6 g/dL (ref 32.0–36.0)
MCV: 92 fL (ref 80–100)
Monocyte #: 0 x10 3/mm — ABNORMAL LOW (ref 0.2–0.9)
Monocyte %: 0.7 %
NEUTROS PCT: 89.8 %
Neutrophil #: 4.1 x10 3/mm (ref 1.4–6.5)
Platelet: 238 x10 3/mm (ref 150–440)
RBC: 4.1 10*6/uL (ref 3.80–5.20)
RDW: 17.9 % — ABNORMAL HIGH (ref 11.5–14.5)
WBC: 4.5 x10 3/mm (ref 3.6–11.0)

## 2014-07-21 LAB — BASIC METABOLIC PANEL
Anion Gap: 12 (ref 7–16)
BUN: 10 mg/dL (ref 7–18)
CHLORIDE: 96 mmol/L — AB (ref 98–107)
CREATININE: 1.15 mg/dL (ref 0.60–1.30)
Calcium, Total: 9.3 mg/dL (ref 8.5–10.1)
Co2: 24 mmol/L (ref 21–32)
EGFR (Non-African Amer.): 53 — ABNORMAL LOW
GLUCOSE: 476 mg/dL — AB (ref 65–99)
Osmolality: 285 (ref 275–301)
Potassium: 4.7 mmol/L (ref 3.5–5.1)
SODIUM: 132 mmol/L — AB (ref 136–145)

## 2014-07-28 LAB — CBC CANCER CENTER
BASOS ABS: 0 x10 3/mm (ref 0.0–0.1)
Basophil %: 0.6 %
Eosinophil #: 0.1 x10 3/mm (ref 0.0–0.7)
Eosinophil %: 1.4 %
HCT: 36.2 % (ref 35.0–47.0)
HGB: 12.1 g/dL (ref 12.0–16.0)
LYMPHS PCT: 33.9 %
Lymphocyte #: 1.9 x10 3/mm (ref 1.0–3.6)
MCH: 30.4 pg (ref 26.0–34.0)
MCHC: 33.4 g/dL (ref 32.0–36.0)
MCV: 91 fL (ref 80–100)
MONO ABS: 0.3 x10 3/mm (ref 0.2–0.9)
MONOS PCT: 4.8 %
Neutrophil #: 3.3 x10 3/mm (ref 1.4–6.5)
Neutrophil %: 59.3 %
Platelet: 242 x10 3/mm (ref 150–440)
RBC: 3.97 10*6/uL (ref 3.80–5.20)
RDW: 16.9 % — AB (ref 11.5–14.5)
WBC: 5.5 x10 3/mm (ref 3.6–11.0)

## 2014-07-28 LAB — BASIC METABOLIC PANEL
ANION GAP: 11 (ref 7–16)
BUN: 9 mg/dL (ref 7–18)
CALCIUM: 9 mg/dL (ref 8.5–10.1)
CHLORIDE: 101 mmol/L (ref 98–107)
Co2: 26 mmol/L (ref 21–32)
Creatinine: 0.88 mg/dL (ref 0.60–1.30)
EGFR (African American): >60
EGFR (Non-African Amer.): >60
Glucose: 257 mg/dL — ABNORMAL HIGH (ref 65–99)
OSMOLALITY: 283 (ref 275–301)
Potassium: 4.1 mmol/L (ref 3.5–5.1)
Sodium: 138 mmol/L (ref 136–145)

## 2014-07-30 ENCOUNTER — Ambulatory Visit: Payer: Self-pay | Admitting: Internal Medicine

## 2014-08-03 ENCOUNTER — Ambulatory Visit: Payer: Self-pay | Admitting: Gastroenterology

## 2014-08-04 LAB — BASIC METABOLIC PANEL
Anion Gap: 11 (ref 7–16)
BUN: 7 mg/dL (ref 7–18)
CALCIUM: 9.4 mg/dL (ref 8.5–10.1)
CHLORIDE: 105 mmol/L (ref 98–107)
Co2: 24 mmol/L (ref 21–32)
Creatinine: 0.71 mg/dL (ref 0.60–1.30)
EGFR (African American): >60
EGFR (Non-African Amer.): >60
Glucose: 188 mg/dL — ABNORMAL HIGH (ref 65–99)
Osmolality: 282 (ref 275–301)
Potassium: 3.9 mmol/L (ref 3.5–5.1)
SODIUM: 140 mmol/L (ref 136–145)

## 2014-08-04 LAB — CBC CANCER CENTER
BASOS ABS: 0 x10 3/mm (ref 0.0–0.1)
BASOS PCT: 0.5 %
EOS PCT: 1.5 %
Eosinophil #: 0 x10 3/mm (ref 0.0–0.7)
HCT: 32.5 % — ABNORMAL LOW (ref 35.0–47.0)
HGB: 10.5 g/dL — ABNORMAL LOW (ref 12.0–16.0)
LYMPHS ABS: 1.5 x10 3/mm (ref 1.0–3.6)
Lymphocyte %: 51.1 %
MCH: 30.3 pg (ref 26.0–34.0)
MCHC: 32.4 g/dL (ref 32.0–36.0)
MCV: 94 fL (ref 80–100)
MONO ABS: 0.2 x10 3/mm (ref 0.2–0.9)
MONOS PCT: 5.8 %
NEUTROS PCT: 41.1 %
Neutrophil #: 1.2 x10 3/mm — ABNORMAL LOW (ref 1.4–6.5)
PLATELETS: 218 x10 3/mm (ref 150–440)
RBC: 3.46 10*6/uL — AB (ref 3.80–5.20)
RDW: 16.9 % — AB (ref 11.5–14.5)
WBC: 2.9 x10 3/mm — AB (ref 3.6–11.0)

## 2014-08-05 LAB — PATHOLOGY REPORT

## 2014-08-18 LAB — CBC CANCER CENTER
BASOS ABS: 0 x10 3/mm (ref 0.0–0.1)
BASOS PCT: 0.2 %
EOS ABS: 0 x10 3/mm (ref 0.0–0.7)
Eosinophil %: 0.7 %
HCT: 38 % (ref 35.0–47.0)
HGB: 12.4 g/dL (ref 12.0–16.0)
LYMPHS PCT: 46.1 %
Lymphocyte #: 1.2 x10 3/mm (ref 1.0–3.6)
MCH: 31.2 pg (ref 26.0–34.0)
MCHC: 32.5 g/dL (ref 32.0–36.0)
MCV: 96 fL (ref 80–100)
MONO ABS: 0.3 x10 3/mm (ref 0.2–0.9)
MONOS PCT: 12.8 %
NEUTROS ABS: 1.1 x10 3/mm — AB (ref 1.4–6.5)
NEUTROS PCT: 40.2 %
PLATELETS: 165 x10 3/mm (ref 150–440)
RBC: 3.96 10*6/uL (ref 3.80–5.20)
RDW: 15.6 % — ABNORMAL HIGH (ref 11.5–14.5)
WBC: 2.7 x10 3/mm — ABNORMAL LOW (ref 3.6–11.0)

## 2014-08-30 ENCOUNTER — Ambulatory Visit: Payer: Self-pay | Admitting: Internal Medicine

## 2014-09-02 LAB — HEPATIC FUNCTION PANEL A (ARMC)
ALBUMIN: 3.8 g/dL (ref 3.4–5.0)
ALT: 13 U/L — AB
Alkaline Phosphatase: 77 U/L
BILIRUBIN DIRECT: 0.1 mg/dL (ref 0.0–0.2)
Bilirubin,Total: 0.2 mg/dL (ref 0.2–1.0)
SGOT(AST): 16 U/L (ref 15–37)
Total Protein: 7.4 g/dL (ref 6.4–8.2)

## 2014-09-02 LAB — CBC CANCER CENTER
BASOS ABS: 0 x10 3/mm (ref 0.0–0.1)
Basophil %: 0.3 %
Eosinophil #: 0.1 x10 3/mm (ref 0.0–0.7)
Eosinophil %: 1.3 %
HCT: 36.6 % (ref 35.0–47.0)
HGB: 12.1 g/dL (ref 12.0–16.0)
LYMPHS ABS: 1.1 x10 3/mm (ref 1.0–3.6)
LYMPHS PCT: 24.8 %
MCH: 31.3 pg (ref 26.0–34.0)
MCHC: 32.9 g/dL (ref 32.0–36.0)
MCV: 95 fL (ref 80–100)
MONO ABS: 0.4 x10 3/mm (ref 0.2–0.9)
Monocyte %: 8.5 %
NEUTROS ABS: 2.8 x10 3/mm (ref 1.4–6.5)
Neutrophil %: 65.1 %
PLATELETS: 186 x10 3/mm (ref 150–440)
RBC: 3.85 10*6/uL (ref 3.80–5.20)
RDW: 14 % (ref 11.5–14.5)
WBC: 4.3 x10 3/mm (ref 3.6–11.0)

## 2014-09-02 LAB — BASIC METABOLIC PANEL
Anion Gap: 8 (ref 7–16)
BUN: 11 mg/dL (ref 7–18)
CHLORIDE: 104 mmol/L (ref 98–107)
CO2: 27 mmol/L (ref 21–32)
Calcium, Total: 9.3 mg/dL (ref 8.5–10.1)
Creatinine: 0.72 mg/dL (ref 0.60–1.30)
EGFR (African American): >60
GLUCOSE: 112 mg/dL — AB (ref 65–99)
OSMOLALITY: 278 (ref 275–301)
POTASSIUM: 3.9 mmol/L (ref 3.5–5.1)
Sodium: 139 mmol/L (ref 136–145)

## 2014-09-29 ENCOUNTER — Ambulatory Visit: Payer: Self-pay | Admitting: Internal Medicine

## 2014-09-30 LAB — CBC CANCER CENTER
BASOS PCT: 0.3 %
Basophil #: 0 x10 3/mm (ref 0.0–0.1)
EOS ABS: 0 x10 3/mm (ref 0.0–0.7)
EOS PCT: 1 %
HCT: 38.9 % (ref 35.0–47.0)
HGB: 12.9 g/dL (ref 12.0–16.0)
Lymphocyte #: 0.9 x10 3/mm — ABNORMAL LOW (ref 1.0–3.6)
Lymphocyte %: 25.7 %
MCH: 30.7 pg (ref 26.0–34.0)
MCHC: 33 g/dL (ref 32.0–36.0)
MCV: 93 fL (ref 80–100)
MONO ABS: 0.4 x10 3/mm (ref 0.2–0.9)
MONOS PCT: 10.5 %
Neutrophil #: 2.1 x10 3/mm (ref 1.4–6.5)
Neutrophil %: 62.5 %
PLATELETS: 208 x10 3/mm (ref 150–440)
RBC: 4.18 10*6/uL (ref 3.80–5.20)
RDW: 13.6 % (ref 11.5–14.5)
WBC: 3.4 x10 3/mm — AB (ref 3.6–11.0)

## 2014-09-30 LAB — HEPATIC FUNCTION PANEL A (ARMC)
ALK PHOS: 93 U/L
ALT: 25 U/L
AST: 18 U/L (ref 15–37)
Albumin: 3.6 g/dL (ref 3.4–5.0)
BILIRUBIN DIRECT: 0.1 mg/dL (ref 0.0–0.2)
Bilirubin,Total: 0.2 mg/dL (ref 0.2–1.0)
TOTAL PROTEIN: 7.3 g/dL (ref 6.4–8.2)

## 2014-09-30 LAB — BASIC METABOLIC PANEL
ANION GAP: 8 (ref 7–16)
BUN: 12 mg/dL (ref 7–18)
CO2: 30 mmol/L (ref 21–32)
Calcium, Total: 9.4 mg/dL (ref 8.5–10.1)
Chloride: 105 mmol/L (ref 98–107)
Creatinine: 0.9 mg/dL (ref 0.60–1.30)
EGFR (Non-African Amer.): >60
Glucose: 115 mg/dL — ABNORMAL HIGH (ref 65–99)
OSMOLALITY: 286 (ref 275–301)
Potassium: 4.5 mmol/L (ref 3.5–5.1)
SODIUM: 143 mmol/L (ref 136–145)

## 2014-10-14 LAB — HEPATIC FUNCTION PANEL A (ARMC)
ALK PHOS: 95 U/L
AST: 15 U/L (ref 15–37)
Albumin: 3.4 g/dL (ref 3.4–5.0)
BILIRUBIN TOTAL: 0.2 mg/dL (ref 0.2–1.0)
SGPT (ALT): 27 U/L
Total Protein: 6.7 g/dL (ref 6.4–8.2)

## 2014-10-14 LAB — PHOSPHORUS: PHOSPHORUS: 3.2 mg/dL (ref 2.5–4.9)

## 2014-10-14 LAB — CBC CANCER CENTER
BASOS ABS: 0 x10 3/mm (ref 0.0–0.1)
BASOS PCT: 0.3 %
Eosinophil #: 0.1 x10 3/mm (ref 0.0–0.7)
Eosinophil %: 2.1 %
HCT: 35.6 % (ref 35.0–47.0)
HGB: 11.8 g/dL — AB (ref 12.0–16.0)
LYMPHS ABS: 1.3 x10 3/mm (ref 1.0–3.6)
LYMPHS PCT: 36.4 %
MCH: 30.2 pg (ref 26.0–34.0)
MCHC: 33 g/dL (ref 32.0–36.0)
MCV: 92 fL (ref 80–100)
MONOS PCT: 8.9 %
Monocyte #: 0.3 x10 3/mm (ref 0.2–0.9)
Neutrophil #: 1.8 x10 3/mm (ref 1.4–6.5)
Neutrophil %: 52.3 %
Platelet: 191 x10 3/mm (ref 150–440)
RBC: 3.89 10*6/uL (ref 3.80–5.20)
RDW: 13.8 % (ref 11.5–14.5)
WBC: 3.5 x10 3/mm — ABNORMAL LOW (ref 3.6–11.0)

## 2014-10-14 LAB — BASIC METABOLIC PANEL
ANION GAP: 9 (ref 7–16)
BUN: 16 mg/dL (ref 7–18)
CALCIUM: 8.9 mg/dL (ref 8.5–10.1)
CHLORIDE: 105 mmol/L (ref 98–107)
CREATININE: 0.94 mg/dL (ref 0.60–1.30)
Co2: 28 mmol/L (ref 21–32)
EGFR (African American): >60
GLUCOSE: 170 mg/dL — AB (ref 65–99)
Osmolality: 288 (ref 275–301)
Potassium: 3.7 mmol/L (ref 3.5–5.1)
Sodium: 142 mmol/L (ref 136–145)

## 2014-10-14 LAB — MAGNESIUM: MAGNESIUM: 1.5 mg/dL — AB

## 2014-10-30 ENCOUNTER — Ambulatory Visit: Payer: Self-pay | Admitting: Internal Medicine

## 2014-11-04 LAB — CBC CANCER CENTER
Basophil #: 0 x10 3/mm (ref 0.0–0.1)
Basophil %: 0.3 %
EOS ABS: 0.1 x10 3/mm (ref 0.0–0.7)
Eosinophil %: 1.2 %
HCT: 35.6 % (ref 35.0–47.0)
HGB: 11.8 g/dL — ABNORMAL LOW (ref 12.0–16.0)
LYMPHS PCT: 31.6 %
Lymphocyte #: 1.3 x10 3/mm (ref 1.0–3.6)
MCH: 30.1 pg (ref 26.0–34.0)
MCHC: 33.2 g/dL (ref 32.0–36.0)
MCV: 91 fL (ref 80–100)
MONOS PCT: 10.1 %
Monocyte #: 0.4 x10 3/mm (ref 0.2–0.9)
NEUTROS ABS: 2.3 x10 3/mm (ref 1.4–6.5)
NEUTROS PCT: 56.8 %
PLATELETS: 187 x10 3/mm (ref 150–440)
RBC: 3.93 10*6/uL (ref 3.80–5.20)
RDW: 13.6 % (ref 11.5–14.5)
WBC: 4 x10 3/mm (ref 3.6–11.0)

## 2014-11-04 LAB — BASIC METABOLIC PANEL
Anion Gap: 7 (ref 7–16)
BUN: 16 mg/dL (ref 7–18)
CALCIUM: 8.6 mg/dL (ref 8.5–10.1)
CREATININE: 0.86 mg/dL (ref 0.60–1.30)
Chloride: 105 mmol/L (ref 98–107)
Co2: 30 mmol/L (ref 21–32)
EGFR (African American): >60
GLUCOSE: 134 mg/dL — AB (ref 65–99)
Osmolality: 286 (ref 275–301)
Potassium: 4 mmol/L (ref 3.5–5.1)
SODIUM: 142 mmol/L (ref 136–145)

## 2014-11-04 LAB — MAGNESIUM: Magnesium: 1.8 mg/dL

## 2014-11-04 LAB — HEPATIC FUNCTION PANEL A (ARMC)
Albumin: 3.3 g/dL — ABNORMAL LOW (ref 3.4–5.0)
Alkaline Phosphatase: 85 U/L
BILIRUBIN DIRECT: 0.1 mg/dL (ref 0.0–0.2)
BILIRUBIN TOTAL: 0.2 mg/dL (ref 0.2–1.0)
SGOT(AST): 13 U/L — ABNORMAL LOW (ref 15–37)
SGPT (ALT): 23 U/L
Total Protein: 6.7 g/dL (ref 6.4–8.2)

## 2014-11-04 LAB — PHOSPHORUS: PHOSPHORUS: 3.4 mg/dL (ref 2.5–4.9)

## 2014-11-25 LAB — CBC CANCER CENTER
BASOS PCT: 0.4 %
Basophil #: 0 x10 3/mm (ref 0.0–0.1)
Eosinophil #: 0.1 x10 3/mm (ref 0.0–0.7)
Eosinophil %: 1.6 %
HCT: 39.7 % (ref 35.0–47.0)
HGB: 13.1 g/dL (ref 12.0–16.0)
LYMPHS ABS: 1.2 x10 3/mm (ref 1.0–3.6)
Lymphocyte %: 32.7 %
MCH: 29.7 pg (ref 26.0–34.0)
MCHC: 32.9 g/dL (ref 32.0–36.0)
MCV: 90 fL (ref 80–100)
MONO ABS: 0.3 x10 3/mm (ref 0.2–0.9)
MONOS PCT: 9.4 %
NEUTROS PCT: 55.9 %
Neutrophil #: 2 x10 3/mm (ref 1.4–6.5)
Platelet: 205 x10 3/mm (ref 150–440)
RBC: 4.4 10*6/uL (ref 3.80–5.20)
RDW: 14.1 % (ref 11.5–14.5)
WBC: 3.5 x10 3/mm — AB (ref 3.6–11.0)

## 2014-11-25 LAB — BASIC METABOLIC PANEL
ANION GAP: 11 (ref 7–16)
BUN: 18 mg/dL (ref 7–18)
CREATININE: 0.89 mg/dL (ref 0.60–1.30)
Calcium, Total: 8.7 mg/dL (ref 8.5–10.1)
Chloride: 102 mmol/L (ref 98–107)
Co2: 28 mmol/L (ref 21–32)
EGFR (African American): >60
GLUCOSE: 188 mg/dL — AB (ref 65–99)
Osmolality: 288 (ref 275–301)
Potassium: 4.1 mmol/L (ref 3.5–5.1)
Sodium: 141 mmol/L (ref 136–145)

## 2014-11-25 LAB — HEPATIC FUNCTION PANEL A (ARMC)
Albumin: 3.5 g/dL (ref 3.4–5.0)
Alkaline Phosphatase: 91 U/L (ref 46–116)
Bilirubin,Total: 0.2 mg/dL (ref 0.2–1.0)
SGOT(AST): 17 U/L (ref 15–37)
SGPT (ALT): 23 U/L (ref 14–63)
Total Protein: 7.1 g/dL (ref 6.4–8.2)

## 2014-11-25 LAB — PHOSPHORUS: PHOSPHORUS: 3.8 mg/dL (ref 2.5–4.9)

## 2014-11-30 ENCOUNTER — Ambulatory Visit: Payer: Self-pay | Admitting: Internal Medicine

## 2014-12-29 ENCOUNTER — Ambulatory Visit
Admit: 2014-12-29 | Disposition: A | Discharge status: Home / Self Care | Payer: Self-pay | Attending: Internal Medicine | Admitting: Internal Medicine

## 2015-01-07 ENCOUNTER — Ambulatory Visit: Payer: Self-pay | Admitting: Internal Medicine

## 2015-01-29 ENCOUNTER — Ambulatory Visit
Admit: 2015-01-29 | Disposition: A | Discharge status: Home / Self Care | Payer: Self-pay | Attending: Internal Medicine | Admitting: Internal Medicine

## 2015-02-08 LAB — CBC CANCER CENTER
BASOS ABS: 0 x10 3/mm (ref 0.0–0.1)
Basophil %: 0.3 %
EOS ABS: 0.1 x10 3/mm (ref 0.0–0.7)
Eosinophil %: 2.2 %
HCT: 37.5 % (ref 35.0–47.0)
HGB: 12.8 g/dL (ref 12.0–16.0)
LYMPHS ABS: 0.9 x10 3/mm — AB (ref 1.0–3.6)
Lymphocyte %: 31.5 %
MCH: 30.2 pg (ref 26.0–34.0)
MCHC: 34 g/dL (ref 32.0–36.0)
MCV: 89 fL (ref 80–100)
MONO ABS: 0.2 x10 3/mm (ref 0.2–0.9)
Monocyte %: 8.7 %
NEUTROS ABS: 1.6 x10 3/mm (ref 1.4–6.5)
Neutrophil %: 57.3 %
Platelet: 155 x10 3/mm (ref 150–440)
RBC: 4.22 10*6/uL (ref 3.80–5.20)
RDW: 13.3 % (ref 11.5–14.5)
WBC: 2.8 x10 3/mm — ABNORMAL LOW (ref 3.6–11.0)

## 2015-02-08 LAB — HEPATIC FUNCTION PANEL A (ARMC)
ALBUMIN: 3.7 g/dL
ALK PHOS: 66 U/L
ALT: 12 U/L — AB
AST: 12 U/L — AB
BILIRUBIN TOTAL: 0.7 mg/dL
Bilirubin, Direct: 0.1 mg/dL
Indirect Bilirubin: 0.6
TOTAL PROTEIN: 6.8 g/dL

## 2015-02-08 LAB — CREATININE, SERUM
CREATININE: 0.82 mg/dL
EGFR (Non-African Amer.): >60

## 2015-02-08 LAB — PHOSPHORUS: Phosphorus: 2.7 mg/dL

## 2015-02-20 NOTE — Op Note (Signed)
PATIENT NAME:  Terri Marks, SCHOLLE MR#:  [id number] DATE OF BIRTH:  29-May-1956  DATE OF PROCEDURE:  05/28/2014  PREOPERATIVE DIAGNOSIS:  Lung cancer with poor venous access.   POSTOPERATIVE DIAGNOSIS:  Lung cancer with poor venous access.   PROCEDURES:  1.  Ultrasound guidance for vascular access, right internal jugular vein.  2.  Fluoroscopic guidance for placement of catheter.  3.  Placement of CT compatible Port-A-Cath, right internal jugular vein.   SURGEON:   Algernon Huxley, MD.  ANESTHESIA:  Local with moderate conscious sedation.   FLUOROSCOPY TIME:  Less than 1 minute.   CONTRAST:  Zero.   ESTIMATED BLOOD LOSS:  Minimal.  INDICATION FOR PROCEDURE:  A 59 year old [place] American female with lung cancer who needs a Port-A-Cath for chemotherapy and durable venous access. Risks and benefits were discussed. Informed consent was obtained.   DESCRIPTION OF THE PROCEDURE:  The patient was brought to the vascular and interventional radiology suite. The right neck and chest were sterilely prepped and draped, and a sterile surgical field was created. Ultrasound was used to help visualize a patent right internal jugular vein. This was then accessed under direct ultrasound guidance without difficulty with a Seldinger needle and a permanent image was recorded. A J-wire was placed. After skin nick and dilatation, the peel-away sheath was then placed over the wire. I then anesthetized an area under the clavicle approximately 2 fingerbreadths. A transverse incision was created and an inferior pocket was created with electrocautery and blunt dissection. The port was then brought onto the field, placed into the pocket and secured to the chest wall with 2 Prolene sutures. The catheter was connected to the port and tunneled from the subclavicular incision to the access site. Fluoroscopic guidance was used to cut the catheter to an appropriate length. The catheter was then placed through the peel-away sheath and  the peel-away sheath was removed. The catheter tip was parked in excellent location in the cavoatrial junction. The pocket was then irrigated with antibiotic-impregnated saline and the wound was closed with a running 3-0 Vicryl and a 4-0 Monocryl. The access incision was closed with a single 4-0 Monocryl. The Huber needle was used to withdraw blood and flush the port with heparinized saline. Dermabond was then placed as a dressing. The patient tolerated the procedure well and was taken to the recovery room in stable condition.     ____________________________ Algernon Huxley, MD jsd:ts D: 05/28/2014 11:37:40 ET T: 05/28/2014 12:06:14 ET JOB#: [id number]  cc: Algernon Huxley, MD, <Dictator> Algernon Huxley MD ELECTRONICALLY SIGNED 06/09/2014 14:11

## 2015-02-20 NOTE — Consult Note (Signed)
Reason for Visit: This 59 year old Female Terri Marks presents to the clinic for initial evaluation of  left orbit metastasis .   Referred by Dr Ma Hillock.  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 Left lung biopsy 04/16/2014 was positive for adenocarcinoma acinar and micropapillary patterns. Morphologically as this was consistent with lung primary. She was started by medical oncology on Taxol carboplatin and Avastin and Zometa for bone metastasis. She's been having increasing distortion of her vision especially from the left orbit ophthalmology eye examination showed a 4 disc diameter choridallesion consistent with metastatic disease. Initial MRI of the orbits do not demonstrate this although recent MRI scan of her orbits to demonstrate a subtle irregular thickening of the posterior left orbit. She also has an eye examination posterior detachment of the vitreous on the right orbit and bilateral cataracts. Her pain is under control at this time. I been asked to evaluate the Terri Marks for possible palliative radiation therapy to her left orbit. She states her vision is intermittent and fuzzy does have difficulty reading at this time  Past Hx:    Arthritis:    Diabetes:    Denies medical history:    C-Section:    Hysterectomy - Total:   Past, Family and Social History:  Past Medical History positive   Cardiovascular hyperlipidemia   Endocrine diabetes mellitus   Past Surgical History C-section,  hysterectomy   Past Medical History Comments arthritis, recurrent sinus infections   Family History positive   Family History Comments Sr. with rheumatoid arthritis grandmother with breast cancer at age 93   Social History positive   Social History Comments no smoking history social EtOH use history   Additional Past Medical and Surgical History accompanied by family member today   Allergies:   Omnicef: GI Distress  Septra: GI Distress  Keflex: GI Distress  Flagyl: GI Distress  Home Meds:  Home Medications: Medication Instructions Status  granisetron 1 mg oral tablet 1 tab(s) orally every 8 hours, As Needed Active  Emla 2.5%-2.5% topical cream Apply topically to affected area once each time 1 hour prior to Portacath being accessed. Active  Norco 325 mg-5 mg oral tablet 1 - 2 tablets orally every 6 hours as needed for pain. Active  promethazine 25 mg oral tablet 1 tab(s) orally every 6 hours as needed for nausea or vomiting Active  dexamethasone 4 mg oral tablet Take 3 tablets (12 mg) at 10 PM the night before and take 3 tablets (12 mg) at 6 AM the morning of each Taxol chemotherapy (first dose of Taxol is planned for 05/11/14).  Active  Vitamin D2 50,000 intl units oral capsule 1 cap(s) orally once a week Active  Onglyza 5 mg oral tablet 1 tab(s) orally once a day Active  GlipiZIDE XL 10 mg oral tablet, extended release 2 tab(s) orally once a day Active  duke's MMW 5 milliliter(s) orally 4 times a day, As Needed for sores in mouth. Active  ondansetron 4 mg oral tablet 1 tab(s) orally  every 4 hours as needed for n/v Active  Zofran 4 mg oral tablet 1 tab(s) orally every 8 hours Active  Zofran ODT 4 mg oral tablet, disintegrating 1 tab(s) orally every 4 hours, As Needed - for Nausea, Vomiting Active  folic acid 1 mg oral tablet 1 tab(s) orally once a day Active   Review of Systems:  General negative   Performance Status (ECOG) 0   Skin negative   Breast negative    Ophthalmologic see HPI   ENMT negative   Respiratory and Thorax see HPI   Cardiovascular negative   Gastrointestinal negative   Genitourinary negative   Musculoskeletal negative   Neurological negative   Psychiatric negative   Hematology/Lymphatics negative   Endocrine negative   Allergic/Immunologic negative   Nursing Notes:  Nursing Vital Signs and Chemo Nursing Nursing Notes: *CC Vital Signs Flowsheet:   26-Aug-15 14:00  Temp Temperature 95.9  Pulse Pulse 103  Respirations Respirations 20  SBP SBP 138  DBP DBP 85  Pain Scale (0-10)  0  Current Weight (kg) (kg) 56.2  Height (cm) centimeters 157.4  BSA (m2) 1.5   Physical Exam:  General/Skin/HEENT:  General normal   Skin normal   ENMT normal   Head and Neck normal   Additional PE well-developed female in NAD. Visual fields within normal range. Extraocular motion is intact. Pupils are equal and symmetric. Indirect mirror examination shows some subtle distortion of the posterior left orbit. No cervical or subclavicular adenopathy is identified. Lungs are clear to A&P cardiac examination shows regular rate and rhythm.   Breasts/Resp/CV/GI/GU:  Respiratory and Thorax normal   Cardiovascular normal   Gastrointestinal normal   Genitourinary normal   MS/Neuro/Psych/Lymph:  Musculoskeletal normal   Neurological normal   Lymphatics normal   Other Results:  Radiology Results: LabUnknown:    11-Aug-15 13:45, MRI Orbits [facility]  PACS Image     18-Aug-15 13:11, CT Chest With Contrast  PACS Image   MRI:    11-Aug-15 13:45, MRI Orbits [facility]  MRI Orbits [facility]   REASON FOR EXAM:    left orbit eval metastatic lesion from lung cancer  COMMENTS:       PROCEDURE: MR  - MR ORBITS WO/W CONTRAST  - Jun 09 2014  1:45PM     CLINICAL DATA:  59 year old female with visual changes. Left orbit  metastatic lesion from lung cancer. Subsequent encounter.    EXAM:  MRI ORBITS WITHOUT AND WITH  CONTRAST    TECHNIQUE:  Multiplanar and multiecho pulse sequences of the orbits and  surrounding structures were obtained including fat saturation  techniques, before and after intravenous contrast administration.  CONTRAST:  12 mL MultiHance.    COMPARISON:  Brain MRI 04/27/2014.    FINDINGS:  Major intracranial vascular flow voids are preserved. Normal  cavernous sinus. No suprasellar mass or mass effect. Normal optic  chiasm.Visible optic radiations appear stable and within normal  limits.    No intraorbital inflammation or space-occupying mass. Bilateral  optic nerves appear symmetric and within normal limits. No abnormal  optic nerve enhancement. Lacrimal glands and extraocular muscles  appear within normal limits.  The globes appear symmetric and unremarkable, except for possible  subtle retinal or choroidal thickening of the left posterior globe  (just above the level of the lens) seen only on series 7, image 11.  This is not correlated with asymmetry on the T2 weighted images or  in the coronal imaging plane. There is no other abnormal enhancement  or  abnormality of the globe identified.    Superficial periorbital soft tissues appear within normal limits.  Visualized bone marrow signal is stable and within normal limits.    Minor paranasal sinus mucosal thickening.     IMPRESSION:  1. Minimal if any asymmetry of the left globe posterior  retina/choroid, seen only on series 7, image 11. The subtlety of  this finding seems to favor artifact or benign thickening over a  bona fide soft tissue metastasis.  Funduscopic exam I feel would probably be the gold standard in this  case where this lesion is concerned.  2. Otherwise negative MRI appearance of the orbits. No other  abnormal enhancement or metastatic process identified.      Electronically Signed    By: Lars Pinks M.D.    On: 06/09/2014 14:40         Verified By: Gwenyth Bender. HALL, M.D.,  CT:    18-Aug-15  13:11, CT Chest With Contrast  CT Chest With Contrast   REASON FOR EXAM:    Restaging Lung CA on Chemo  COMMENTS:       PROCEDURE: CT  - CT CHEST WITH CONTRAST  - Jun 16 2014  1:11PM     CLINICAL DATA:  Restaging non-small cell lung cancer. Chemotherapy  ongoing    EXAM:  CT CHEST WITH CONTRAST    TECHNIQUE:  Multidetector CT imaging of the chest was performed during  intravenous contrast administration.  CONTRAST:  75 mL Isovue    COMPARISON:  04/09/2014    FINDINGS:  Visually the left upper lobe mass is slightly decreased in size and  lesion measures stress may elongated lesion measures approximate  same at 31 x 68 mm compared to 33 x 16 mm prior. Central  calcification in the periphery of the lesion.    There is a small nodule in superior segment of the left lower lobe  (image 17, series) unchanged from 5 mm on prior.    No axillary or supraclavicular lymphadenopathy. No new mediastinal  adenopathy. The left upper lobe mass does extend into the left hilar  region not changed from prior.    No pericardial fluid.  Esophagus is normal.    Limited view of the upper abdomen demonstrates no focal lesion on  limited exam of the liver. Adrenal glands not completely image. A    Gain demonstrated endplate changes at T3 and T4 which are  indeterminate. Lesion on bone scan in T10 is not clearly identified.     IMPRESSION:  1. Elongated left upper lobe mass which extends into the left hilum  visually appears slightly smaller but measures unchanged in size.  2. No evidence of disease progression.  3. Left lower lobe pulmonary nodule is stable. Recommend attention  on follow-up.  4. No change in skeletal findings.      Electronically Signed    By: Suzy Bouchard M.D.    On: 06/16/2014 14:16         Verified By: Rennis Golden, M.D.,   Relevent Results:   Relevant Scans and Labs MRI scans of the orbits brain and CT scan of the chest are reviewed   Assessment and  Plan: Impression:   left posterior orbit metastasis from known stage IV lung cancer in 59 year old female Plan:   the stomach to target her posterior orbit and treat with a palliative course of radiation therapy. Would probably use a lower fractionation scheme 3000 cGy in 15 fractions using IM RT treatment planning  and delivery this will be close to the optic nerve. Terri Marks already has developed cataracts although I warned that this is a risk of treating her I. I will review her MRI scans with radiology at our weekly tumor conference before proceeding with [place]. I ordered CT simulation for early next week. Risks and benefits of treatment including again cataract formation, skin reaction, fatigue, all were discussed in detail with the Terri Marks and her family member.  I would like to take this opportunity for allowing me to participate in the care of your Terri Marks..  Electronic Signatures: Armstead Peaks (MD)  (Signed 26-Aug-15 14:58)  Authored: HPI, Diagnosis, Past Hx, PFSH, Allergies, Home Meds, ROS, Nursing Notes, Physical Exam, Other Results, Relevent Results, Encounter Assessment and Plan   Last Updated: 26-Aug-15 14:58 by Armstead Peaks (MD)

## 2015-02-22 LAB — CBC CANCER CENTER
BASOS ABS: 0 x10 3/mm (ref 0.0–0.1)
BASOS PCT: 0.4 %
EOS PCT: 2.5 %
Eosinophil #: 0.1 x10 3/mm (ref 0.0–0.7)
HCT: 37.9 % (ref 35.0–47.0)
HGB: 12.8 g/dL (ref 12.0–16.0)
LYMPHS PCT: 31.5 %
Lymphocyte #: 1.2 x10 3/mm (ref 1.0–3.6)
MCH: 29.9 pg (ref 26.0–34.0)
MCHC: 33.9 g/dL (ref 32.0–36.0)
MCV: 88 fL (ref 80–100)
MONOS PCT: 7.7 %
Monocyte #: 0.3 x10 3/mm (ref 0.2–0.9)
NEUTROS ABS: 2.2 x10 3/mm (ref 1.4–6.5)
Neutrophil %: 57.9 %
Platelet: 149 x10 3/mm — ABNORMAL LOW (ref 150–440)
RBC: 4.3 10*6/uL (ref 3.80–5.20)
RDW: 12.8 % (ref 11.5–14.5)
WBC: 3.7 x10 3/mm (ref 3.6–11.0)

## 2015-02-22 LAB — BASIC METABOLIC PANEL
ANION GAP: 8 (ref 7–16)
BUN: 13 mg/dL
CALCIUM: 9.4 mg/dL
CHLORIDE: 100 mmol/L — AB
Co2: 26 mmol/L
Creatinine: 0.97 mg/dL
EGFR (Non-African Amer.): >60
Glucose: 357 mg/dL — ABNORMAL HIGH
Potassium: 3.8 mmol/L
Sodium: 134 mmol/L — ABNORMAL LOW

## 2015-02-22 LAB — HEPATIC FUNCTION PANEL A (ARMC)
ALBUMIN: 4.1 g/dL
ALT: 14 U/L
AST: 16 U/L
Alkaline Phosphatase: 74 U/L
Bilirubin, Direct: <0.1 mg/dL
Bilirubin,Total: 0.4 mg/dL
TOTAL PROTEIN: 7.2 g/dL

## 2015-02-22 LAB — MAGNESIUM: Magnesium: 1.6 mg/dL — ABNORMAL LOW

## 2015-02-22 LAB — PHOSPHORUS: Phosphorus: 3 mg/dL

## 2015-02-25 ENCOUNTER — Other Ambulatory Visit: Payer: Self-pay | Admitting: Internal Medicine

## 2015-02-25 DIAGNOSIS — C3490 Malignant neoplasm of unspecified part of unspecified bronchus or lung: Secondary | ICD-10-CM

## 2015-03-22 ENCOUNTER — Other Ambulatory Visit: Payer: Self-pay | Admitting: *Deleted

## 2015-03-22 ENCOUNTER — Other Ambulatory Visit: Payer: Self-pay

## 2015-03-22 ENCOUNTER — Inpatient Hospital Stay: Payer: 59 | Attending: Internal Medicine

## 2015-03-22 ENCOUNTER — Encounter: Payer: Self-pay | Admitting: *Deleted

## 2015-03-22 ENCOUNTER — Inpatient Hospital Stay: Payer: 59

## 2015-03-22 DIAGNOSIS — Z79899 Other long term (current) drug therapy: Secondary | ICD-10-CM | POA: Diagnosis not present

## 2015-03-22 DIAGNOSIS — C3492 Malignant neoplasm of unspecified part of left bronchus or lung: Secondary | ICD-10-CM

## 2015-03-22 DIAGNOSIS — D649 Anemia, unspecified: Secondary | ICD-10-CM | POA: Insufficient documentation

## 2015-03-22 DIAGNOSIS — C7951 Secondary malignant neoplasm of bone: Secondary | ICD-10-CM | POA: Insufficient documentation

## 2015-03-22 HISTORY — DX: Malignant neoplasm of unspecified part of left bronchus or lung: C34.92

## 2015-03-22 LAB — CALCIUM: CALCIUM: 8.8 mg/dL — AB (ref 8.9–10.3)

## 2015-03-22 LAB — CBC WITH DIFFERENTIAL/PLATELET
BASOS ABS: 0 10*3/uL (ref 0–0.1)
Basophils Relative: 0 %
EOS ABS: 0 10*3/uL (ref 0–0.7)
Eosinophils Relative: 1 %
HCT: 38.7 % (ref 35.0–47.0)
Hemoglobin: 12.9 g/dL (ref 12.0–16.0)
LYMPHS PCT: 31 %
Lymphs Abs: 1.1 10*3/uL (ref 1.0–3.6)
MCH: 29.7 pg (ref 26.0–34.0)
MCHC: 33.4 g/dL (ref 32.0–36.0)
MCV: 89 fL (ref 80.0–100.0)
MONO ABS: 0.2 10*3/uL (ref 0.2–0.9)
Monocytes Relative: 7 %
NEUTROS ABS: 2.1 10*3/uL (ref 1.4–6.5)
Neutrophils Relative %: 61 %
Platelets: 145 10*3/uL — ABNORMAL LOW (ref 150–440)
RBC: 4.34 MIL/uL (ref 3.80–5.20)
RDW: 13.1 % (ref 11.5–14.5)
WBC: 3.4 10*3/uL — ABNORMAL LOW (ref 3.6–11.0)

## 2015-03-22 LAB — HEPATIC FUNCTION PANEL
ALBUMIN: 4 g/dL (ref 3.5–5.0)
ALK PHOS: 65 U/L (ref 38–126)
ALT: 15 U/L (ref 14–54)
AST: 14 U/L — AB (ref 15–41)
Bilirubin, Direct: <0.1 mg/dL — ABNORMAL LOW (ref 0.1–0.5)
TOTAL PROTEIN: 6.9 g/dL (ref 6.5–8.1)
Total Bilirubin: 0.3 mg/dL (ref 0.3–1.2)

## 2015-03-22 LAB — CREATININE, SERUM
Creatinine, Ser: 0.7 mg/dL (ref 0.44–1.00)
GFR calc Af Amer: >60 mL/min (ref >60)
GFR calc non Af Amer: >60 mL/min (ref >60)

## 2015-03-22 LAB — PHOSPHORUS: PHOSPHORUS: 3.3 mg/dL (ref 2.5–4.6)

## 2015-03-22 MED ORDER — DENOSUMAB 120 MG/1.7ML ~~LOC~~ SOLN
120.0000 mg | Freq: Once | SUBCUTANEOUS | Status: AC
  Administered 2015-03-22: 120 mg via SUBCUTANEOUS
  Filled 2015-03-22: qty 1.7

## 2015-03-22 MED ORDER — DENOSUMAB 120 MG/1.7ML ~~LOC~~ SOLN: 120.0000 mg | Freq: Once | SUBCUTANEOUS | Status: DC

## 2015-03-25 ENCOUNTER — Telehealth: Payer: Self-pay | Admitting: Internal Medicine

## 2015-03-25 NOTE — Telephone Encounter (Signed)
She said you would know why she is calling and to please return her call. Thanks.

## 2015-03-26 NOTE — Telephone Encounter (Signed)
Called pt and got her voicemail yest . Tried again and she wants to know if the papers to [employer].  I checked this am and it was sent off 3/25 but then sent it again today via fax.  Patient was called and notified and if she needs any further asst she will call us back.

## 2015-04-19 ENCOUNTER — Other Ambulatory Visit: Payer: Self-pay | Admitting: Internal Medicine

## 2015-04-19 ENCOUNTER — Other Ambulatory Visit: Payer: Self-pay

## 2015-04-19 ENCOUNTER — Inpatient Hospital Stay: Payer: 59

## 2015-04-19 ENCOUNTER — Inpatient Hospital Stay: Payer: 59 | Attending: Internal Medicine

## 2015-04-19 DIAGNOSIS — C3492 Malignant neoplasm of unspecified part of left bronchus or lung: Secondary | ICD-10-CM

## 2015-04-19 DIAGNOSIS — Z79899 Other long term (current) drug therapy: Secondary | ICD-10-CM | POA: Diagnosis not present

## 2015-04-19 DIAGNOSIS — C342 Malignant neoplasm of middle lobe, bronchus or lung: Secondary | ICD-10-CM | POA: Insufficient documentation

## 2015-04-19 LAB — CBC WITH DIFFERENTIAL/PLATELET
BASOS PCT: 0 %
Basophils Absolute: 0 10*3/uL (ref 0–0.1)
Eosinophils Absolute: 0.1 10*3/uL (ref 0–0.7)
Eosinophils Relative: 2 %
HCT: 38 % (ref 35.0–47.0)
Hemoglobin: 12.6 g/dL (ref 12.0–16.0)
Lymphocytes Relative: 39 %
Lymphs Abs: 1.3 10*3/uL (ref 1.0–3.6)
MCH: 29.2 pg (ref 26.0–34.0)
MCHC: 33.3 g/dL (ref 32.0–36.0)
MCV: 87.7 fL (ref 80.0–100.0)
Monocytes Absolute: 0.4 10*3/uL (ref 0.2–0.9)
Monocytes Relative: 11 %
NEUTROS PCT: 48 %
Neutro Abs: 1.6 10*3/uL (ref 1.4–6.5)
PLATELETS: 147 10*3/uL — AB (ref 150–440)
RBC: 4.33 MIL/uL (ref 3.80–5.20)
RDW: 13.4 % (ref 11.5–14.5)
WBC: 3.4 10*3/uL — AB (ref 3.6–11.0)

## 2015-04-19 LAB — HEPATIC FUNCTION PANEL
ALT: 12 U/L — AB (ref 14–54)
AST: 16 U/L (ref 15–41)
Albumin: 3.7 g/dL (ref 3.5–5.0)
Alkaline Phosphatase: 66 U/L (ref 38–126)
Bilirubin, Direct: <0.1 mg/dL — ABNORMAL LOW (ref 0.1–0.5)
TOTAL PROTEIN: 6.8 g/dL (ref 6.5–8.1)
Total Bilirubin: 0.4 mg/dL (ref 0.3–1.2)

## 2015-04-19 LAB — CREATININE, SERUM
Creatinine, Ser: 0.74 mg/dL (ref 0.44–1.00)
GFR calc Af Amer: >60 mL/min
GFR calc non Af Amer: >60 mL/min

## 2015-04-19 LAB — PHOSPHORUS: PHOSPHORUS: 3.4 mg/dL (ref 2.5–4.6)

## 2015-04-19 LAB — CALCIUM: Calcium: 8.6 mg/dL — ABNORMAL LOW (ref 8.9–10.3)

## 2015-04-19 MED ORDER — HEPARIN SOD (PORK) LOCK FLUSH 100 UNIT/ML IV SOLN
INTRAVENOUS | Status: AC
  Filled 2015-04-19: qty 5

## 2015-04-19 MED ORDER — DENOSUMAB 120 MG/1.7ML ~~LOC~~ SOLN
120.0000 mg | Freq: Once | SUBCUTANEOUS | Status: AC
  Administered 2015-04-19: 120 mg via SUBCUTANEOUS
  Filled 2015-04-19: qty 1.7

## 2015-04-20 ENCOUNTER — Telehealth: Payer: Self-pay | Admitting: *Deleted

## 2015-04-20 NOTE — Telephone Encounter (Signed)
Informed that papers were faxed on Monday, she states she does not have a PMD, but will get one, probably a GYN  female

## 2015-05-12 ENCOUNTER — Telehealth: Payer: Self-pay | Admitting: *Deleted

## 2015-05-12 DIAGNOSIS — C3492 Malignant neoplasm of unspecified part of left bronchus or lung: Secondary | ICD-10-CM

## 2015-05-12 MED ORDER — HYDROCODONE-ACETAMINOPHEN 5-325 MG PO TABS: ORAL_TABLET | ORAL | Status: DC

## 2015-05-12 NOTE — Telephone Encounter (Signed)
Per Dr Ma Hillock message sent to scheduling to schedule patient for a bone scan, I called and spoke with K Dawkins Dr Collier Bullock nurse and she will have him to look at her scans. I called patient and advised her of this and asked if she is using the pain meds; she reports that she is taking Hydrocodone for pain which she got from Dr Ma Hillock in October. She is not taking Oxycodone and she has not used the patch because the pharmacist scared her with the side effects when she went to pick them up. She said she will put one on today.

## 2015-05-12 NOTE — Telephone Encounter (Signed)
Order entered for [facility] scan

## 2015-05-15 ENCOUNTER — Emergency Department: Payer: 59

## 2015-05-15 ENCOUNTER — Emergency Department
Admission: EM | Admit: 2015-05-15 | Discharge: 2015-05-15 | Disposition: A | Discharge status: Home / Self Care | Payer: 59 | Attending: Emergency Medicine | Admitting: Emergency Medicine

## 2015-05-15 ENCOUNTER — Encounter: Payer: Self-pay | Admitting: Emergency Medicine

## 2015-05-15 DIAGNOSIS — R079 Chest pain, unspecified: Secondary | ICD-10-CM | POA: Insufficient documentation

## 2015-05-15 DIAGNOSIS — G8929 Other chronic pain: Secondary | ICD-10-CM | POA: Diagnosis not present

## 2015-05-15 DIAGNOSIS — R109 Unspecified abdominal pain: Secondary | ICD-10-CM | POA: Insufficient documentation

## 2015-05-15 DIAGNOSIS — E119 Type 2 diabetes mellitus without complications: Secondary | ICD-10-CM | POA: Diagnosis not present

## 2015-05-15 DIAGNOSIS — R0602 Shortness of breath: Secondary | ICD-10-CM | POA: Insufficient documentation

## 2015-05-15 DIAGNOSIS — D0222 Carcinoma in situ of left bronchus and lung: Secondary | ICD-10-CM | POA: Insufficient documentation

## 2015-05-15 DIAGNOSIS — C719 Malignant neoplasm of brain, unspecified: Secondary | ICD-10-CM | POA: Insufficient documentation

## 2015-05-15 DIAGNOSIS — Z79899 Other long term (current) drug therapy: Secondary | ICD-10-CM | POA: Insufficient documentation

## 2015-05-15 DIAGNOSIS — C7931 Secondary malignant neoplasm of brain: Secondary | ICD-10-CM

## 2015-05-15 DIAGNOSIS — R42 Dizziness and giddiness: Secondary | ICD-10-CM

## 2015-05-15 DIAGNOSIS — R110 Nausea: Secondary | ICD-10-CM | POA: Diagnosis present

## 2015-05-15 LAB — CBC WITH DIFFERENTIAL/PLATELET
BASOS ABS: 0 10*3/uL (ref 0–0.1)
BASOS PCT: 0 %
EOS ABS: 0 10*3/uL (ref 0–0.7)
Eosinophils Relative: 1 %
HEMATOCRIT: 38.4 % (ref 35.0–47.0)
HEMOGLOBIN: 13 g/dL (ref 12.0–16.0)
Lymphocytes Relative: 14 %
Lymphs Abs: 1.1 10*3/uL (ref 1.0–3.6)
MCH: 29.6 pg (ref 26.0–34.0)
MCHC: 33.8 g/dL (ref 32.0–36.0)
MCV: 87.5 fL (ref 80.0–100.0)
MONOS PCT: 5 %
Monocytes Absolute: 0.4 10*3/uL (ref 0.2–0.9)
NEUTROS PCT: 80 %
Neutro Abs: 6.5 10*3/uL (ref 1.4–6.5)
Platelets: 142 10*3/uL — ABNORMAL LOW (ref 150–440)
RBC: 4.39 MIL/uL (ref 3.80–5.20)
RDW: 13.5 % (ref 11.5–14.5)
WBC: 8.1 10*3/uL (ref 3.6–11.0)

## 2015-05-15 LAB — URINALYSIS COMPLETE WITH MICROSCOPIC (ARMC ONLY)
BILIRUBIN URINE: NEGATIVE
Bacteria, UA: NONE SEEN
Glucose, UA: >500 mg/dL — AB
Hgb urine dipstick: NEGATIVE
Ketones, ur: NEGATIVE mg/dL
LEUKOCYTES UA: NEGATIVE
NITRITE: NEGATIVE
Protein, ur: NEGATIVE mg/dL
Specific Gravity, Urine: 1.023 (ref 1.005–1.030)
pH: 6 (ref 5.0–8.0)

## 2015-05-15 LAB — COMPREHENSIVE METABOLIC PANEL
ALT: 10 U/L — ABNORMAL LOW (ref 14–54)
ANION GAP: 10 (ref 5–15)
AST: 14 U/L — ABNORMAL LOW (ref 15–41)
Albumin: 3.8 g/dL (ref 3.5–5.0)
Alkaline Phosphatase: 71 U/L (ref 38–126)
BUN: 12 mg/dL (ref 6–20)
CALCIUM: 8.7 mg/dL — AB (ref 8.9–10.3)
CO2: 25 mmol/L (ref 22–32)
Chloride: 103 mmol/L (ref 101–111)
Creatinine, Ser: 0.71 mg/dL (ref 0.44–1.00)
GLUCOSE: 324 mg/dL — AB (ref 65–99)
Potassium: 3.5 mmol/L (ref 3.5–5.1)
SODIUM: 138 mmol/L (ref 135–145)
TOTAL PROTEIN: 7 g/dL (ref 6.5–8.1)
Total Bilirubin: 0.3 mg/dL (ref 0.3–1.2)

## 2015-05-15 MED ORDER — DEXAMETHASONE SODIUM PHOSPHATE 10 MG/ML IJ SOLN
20.0000 mg | Freq: Once | INTRAMUSCULAR | Status: AC
  Administered 2015-05-15: 20 mg via INTRAVENOUS

## 2015-05-15 MED ORDER — ONDANSETRON 4 MG PO TBDP: 4.0000 mg | ORAL_TABLET | Freq: Three times a day (TID) | ORAL | Status: DC | PRN

## 2015-05-15 MED ORDER — ONDANSETRON HCL 4 MG/2ML IJ SOLN
4.0000 mg | Freq: Once | INTRAMUSCULAR | Status: AC
  Administered 2015-05-15: 4 mg via INTRAVENOUS
  Filled 2015-05-15: qty 2

## 2015-05-15 MED ORDER — ONDANSETRON HCL 4 MG/2ML IJ SOLN
INTRAMUSCULAR | Status: AC
  Filled 2015-05-15: qty 2

## 2015-05-15 MED ORDER — DEXAMETHASONE 4 MG PO TABS: ORAL_TABLET | ORAL | Status: AC

## 2015-05-15 MED ORDER — SODIUM CHLORIDE 0.9 % IV SOLN
1000.0000 mL | Freq: Once | INTRAVENOUS | Status: AC
  Administered 2015-05-15: 1000 mL via INTRAVENOUS

## 2015-05-15 MED ORDER — DEXAMETHASONE SODIUM PHOSPHATE 10 MG/ML IJ SOLN
INTRAMUSCULAR | Status: AC
  Filled 2015-05-15: qty 2

## 2015-05-15 MED ORDER — ONDANSETRON HCL 4 MG/2ML IJ SOLN
4.0000 mg | Freq: Once | INTRAMUSCULAR | Status: AC
  Administered 2015-05-15: 4 mg via INTRAVENOUS

## 2015-05-15 NOTE — ED Provider Notes (Signed)
[facility] Emergency Department Provider Note  ____________________________________________  Time seen: On arrival  I have reviewed the triage vital signs and the nursing notes.   HISTORY  Chief Complaint Nausea and Dizziness    HPI Terri Marks is a 59 y.o. female with a history of stage IV non-small cell carcinomawho stopped taking chemotherapy several months ago because it made her feel ill and she wants to feel well as long as possible. She sees Dr. Ma Hillock. She reports she woke up this morning developed severe vertigo symptoms which she is never had before. She had nausea and vomiting. She felt like the room was spinning all over the place. Now she feels much better no longer has vertigo symptoms. No chest pain, no headache currently. No neuro deficits     Past Medical History  Diagnosis Date  . Diabetes mellitus without complication   . Wears glasses   . Arthritis   . Family history of anesthesia complication     sister hard to put under  . Non-small cell carcinoma of left lung, stage 4 03/22/2015    Patient Active Problem List   Diagnosis Date Noted  . Non-small cell carcinoma of left lung, stage 4 03/22/2015    Past Surgical History  Procedure Laterality Date  . Abdominal hysterectomy      partial  . Cesarean section      x2  . Dorsal compartment release Right 06/05/2013    Procedure: RELEASE RIGHT FIRST DORSAL COMPARTMENT (DEQUERVAIN);  Surgeon: Cammie Sickle., MD;  Location: [facility];  Service: Orthopedics;  Laterality: Right;    Current Outpatient Rx  Name  Route  Sig  Dispense  Refill  . glipiZIDE (GLUCOTROL) 5 MG tablet   Oral   Take 5 mg by mouth 2 (two) times daily before a meal.         . HYDROcodone-acetaminophen (NORCO/VICODIN) 5-325 MG per tablet      1 - 2  Tablets every 4 hours as needed   30 tablet   0   . sitaGLIPtin (JANUVIA) 100 MG tablet   Oral   Take 100 mg by mouth daily.            Allergies Bactrim; Bactrim; Flagyl; and Other  History reviewed. No pertinent family history.  Social History History  Substance Use Topics  . Smoking status: Never Smoker   . Smokeless tobacco: Not on file  . Alcohol Use: No    Review of Systems  Constitutional: Negative for fever. Eyes: Negative for visual changes. ENT: Negative for sore throat Cardiovascular: Chronic chest pain Respiratory: Chronic mild shortness of breath Gastrointestinal: Abdominal pain Genitourinary: Negative for dysuria. Musculoskeletal: Negative for back pain. Skin: Negative for rash. Neurological: Negative for headaches or focal weakness. The vertigo Psychiatric: No anxiety  10-point ROS otherwise negative.  ____________________________________________   PHYSICAL EXAM:  VITAL SIGNS: ED Triage Vitals  Enc Vitals Group     BP 05/15/15 0756 135/73 mmHg     Pulse Rate 05/15/15 0756 86     Resp 05/15/15 0756 20     Temp 05/15/15 0756 97.5 F (36.4 C)     Temp Source 05/15/15 0756 Oral     SpO2 05/15/15 0756 100 %     Weight 05/15/15 0756 138 lb (62.596 kg)     Height 05/15/15 0756 '5\' 2"'$  (1.575 m)     Head Cir --      Peak Flow --  Pain Score 05/15/15 0756 7     Pain Loc --      Pain Edu? --      Excl. in [place]? --      Constitutional: Alert and oriented. Well appearing and in no distress. Pleasant and interactive Eyes: Conjunctivae are normal. No nystagmus currently ENT   Head: Normocephalic and atraumatic.   Mouth/Throat: Mucous membranes are moist. Cardiovascular: Normal rate, regular rhythm. Normal and symmetric distal pulses are present in all extremities. No murmurs, rubs, or gallops. Respiratory: Normal respiratory effort without tachypnea nor retractions. Breath sounds are clear and equal bilaterally.  Gastrointestinal: Soft and non-tender in all quadrants. No distention. There is no CVA tenderness. Genitourinary: deferred Musculoskeletal: Nontender with normal  range of motion in all extremities. No lower extremity tenderness nor edema. Neurologic:  Normal speech and language. No gross focal neurologic deficits are appreciated. Skin:  Skin is warm, dry and intact. No rash noted. Psychiatric: Mood and affect are normal. Patient exhibits appropriate insight and judgment.  ____________________________________________    LABS (pertinent positives/negatives)  Labs Reviewed  CBC WITH DIFFERENTIAL/PLATELET  COMPREHENSIVE METABOLIC PANEL  URINALYSIS COMPLETEWITH MICROSCOPIC ([place])    ____________________________________________   EKG  None  ____________________________________________    RADIOLOGY I have personally reviewed any xrays that were ordered on this patient: Called by radiologist regarding brain mets on CT head  ____________________________________________   PROCEDURES  Procedure(s) performed: none  Critical Care performed: yes  CRITICAL CARE Performed by: Lavonia Drafts   Total critical care time: 30  Critical care time was exclusive of separately billable procedures and treating other patients.  Critical care was necessary to treat or prevent imminent or life-threatening deterioration.  Critical care was time spent personally by me on the following activities: development of treatment plan with patient and/or surrogate as well as nursing, discussions with consultants, evaluation of patient's response to treatment, examination of patient, obtaining history from patient or surrogate, ordering and performing treatments and interventions, ordering and review of laboratory studies, ordering and review of radiographic studies, pulse oximetry and re-evaluation of patient's condition.   ____________________________________________   INITIAL IMPRESSION / ASSESSMENT AND PLAN / ED COURSE  Pertinent labs & imaging results that were available during my care of the patient were reviewed by me and considered in my medical  decision making (see chart for details).  Patient well-appearing in the emergency department. The nurse's and I are impressed by her attitude in the face of stage IV cancer. We will give 1 L normal saline, Zofran as well, we will obtain CT head and reevaluate. ----------------------------------------- 9:45 AM on 05/15/2015 -----------------------------------------  Contacted by Dr. Earleen Newport of radiology regarding new brain metastases. Recommends MRI if further eval desired. Patient does not want this at this time.  I discussed with Dr. Ma Hillock of oncology regarding treatment options. Patient is not willing to stay in the hospital. She wants to spend her time with her family. Dr. Ma Hillock recommended 20 mg IV decadron and 4 mg QID Rx. He will see her on Monday. Patient and family are in agreement with this plan.  ____________________________________________   FINAL CLINICAL IMPRESSION(S) / ED DIAGNOSES  Final diagnoses:  Brain metastases  Vertigo     Lavonia Drafts, MD 05/15/15 1002

## 2015-05-15 NOTE — ED Notes (Signed)
Pt returned from CT °

## 2015-05-15 NOTE — Discharge Instructions (Signed)

## 2015-05-15 NOTE — ED Notes (Signed)
Patient transported to CT 

## 2015-05-15 NOTE — ED Notes (Signed)
Patient reports not feeling well over the last few days but then this morning waking with extreme nausea and dizziness. Patient reports chemotherapy was making her sick so she has stopped it a while back.

## 2015-05-17 ENCOUNTER — Ambulatory Visit
Admission: RE | Admit: 2015-05-17 | Discharge: 2015-05-17 | Disposition: A | Discharge status: Home / Self Care | Payer: 59 | Source: Ambulatory Visit | Attending: Internal Medicine | Admitting: Internal Medicine

## 2015-05-17 ENCOUNTER — Ambulatory Visit
Admission: RE | Admit: 2015-05-17 | Discharge: 2015-05-17 | Disposition: A | Discharge status: Home / Self Care | Payer: 59 | Source: Ambulatory Visit

## 2015-05-17 ENCOUNTER — Encounter: Payer: Self-pay | Admitting: Radiation Oncology

## 2015-05-17 ENCOUNTER — Other Ambulatory Visit: Payer: Self-pay

## 2015-05-17 ENCOUNTER — Inpatient Hospital Stay: Payer: 59 | Attending: Internal Medicine | Admitting: Internal Medicine

## 2015-05-17 ENCOUNTER — Ambulatory Visit: Payer: Self-pay | Admitting: Internal Medicine

## 2015-05-17 ENCOUNTER — Inpatient Hospital Stay: Payer: 59

## 2015-05-17 ENCOUNTER — Ambulatory Visit: Payer: Self-pay

## 2015-05-17 ENCOUNTER — Telehealth: Payer: Self-pay | Admitting: *Deleted

## 2015-05-17 ENCOUNTER — Ambulatory Visit
Admission: RE | Admit: 2015-05-17 | Discharge: 2015-05-17 | Disposition: A | Discharge status: Home / Self Care | Payer: 59 | Source: Ambulatory Visit | Attending: Radiation Oncology | Admitting: Radiation Oncology

## 2015-05-17 VITALS — BP 134/78 | HR 80 | Temp 97.9°F | Resp 18 | Ht 62.0 in | Wt 132.3 lb

## 2015-05-17 DIAGNOSIS — Z510 Encounter for antineoplastic radiation therapy: Secondary | ICD-10-CM | POA: Insufficient documentation

## 2015-05-17 DIAGNOSIS — Z923 Personal history of irradiation: Secondary | ICD-10-CM | POA: Insufficient documentation

## 2015-05-17 DIAGNOSIS — Z9221 Personal history of antineoplastic chemotherapy: Secondary | ICD-10-CM | POA: Insufficient documentation

## 2015-05-17 DIAGNOSIS — R918 Other nonspecific abnormal finding of lung field: Secondary | ICD-10-CM | POA: Diagnosis not present

## 2015-05-17 DIAGNOSIS — C3490 Malignant neoplasm of unspecified part of unspecified bronchus or lung: Secondary | ICD-10-CM

## 2015-05-17 DIAGNOSIS — R42 Dizziness and giddiness: Secondary | ICD-10-CM | POA: Diagnosis not present

## 2015-05-17 DIAGNOSIS — C7931 Secondary malignant neoplasm of brain: Secondary | ICD-10-CM | POA: Insufficient documentation

## 2015-05-17 DIAGNOSIS — Z79899 Other long term (current) drug therapy: Secondary | ICD-10-CM | POA: Diagnosis not present

## 2015-05-17 DIAGNOSIS — C3492 Malignant neoplasm of unspecified part of left bronchus or lung: Secondary | ICD-10-CM | POA: Insufficient documentation

## 2015-05-17 DIAGNOSIS — Z08 Encounter for follow-up examination after completed treatment for malignant neoplasm: Secondary | ICD-10-CM | POA: Diagnosis not present

## 2015-05-17 DIAGNOSIS — D649 Anemia, unspecified: Secondary | ICD-10-CM | POA: Insufficient documentation

## 2015-05-17 DIAGNOSIS — C50919 Malignant neoplasm of unspecified site of unspecified female breast: Secondary | ICD-10-CM

## 2015-05-17 DIAGNOSIS — C7951 Secondary malignant neoplasm of bone: Secondary | ICD-10-CM | POA: Insufficient documentation

## 2015-05-17 LAB — CBC WITH DIFFERENTIAL/PLATELET
Basophils Absolute: 0 10*3/uL (ref 0–0.1)
EOS ABS: 0 10*3/uL (ref 0–0.7)
HEMATOCRIT: 42.5 % (ref 35.0–47.0)
Hemoglobin: 14.1 g/dL (ref 12.0–16.0)
Lymphs Abs: 0.5 10*3/uL — ABNORMAL LOW (ref 1.0–3.6)
MCH: 29.3 pg (ref 26.0–34.0)
MCHC: 33.3 g/dL (ref 32.0–36.0)
MCV: 88.2 fL (ref 80.0–100.0)
Monocytes Absolute: 0.1 10*3/uL — ABNORMAL LOW (ref 0.2–0.9)
Monocytes Relative: 2 %
Neutro Abs: 5.8 10*3/uL (ref 1.4–6.5)
Neutrophils Relative %: 89 %
Platelets: 218 10*3/uL (ref 150–440)
RBC: 4.82 MIL/uL (ref 3.80–5.20)
RDW: 13.7 % (ref 11.5–14.5)
WBC: 6.4 10*3/uL (ref 3.6–11.0)

## 2015-05-17 LAB — HEPATIC FUNCTION PANEL
ALT: 15 U/L (ref 14–54)
AST: 17 U/L (ref 15–41)
Albumin: 4.5 g/dL (ref 3.5–5.0)
Alkaline Phosphatase: 80 U/L (ref 38–126)
Total Bilirubin: 0.6 mg/dL (ref 0.3–1.2)
Total Protein: 8.2 g/dL — ABNORMAL HIGH (ref 6.5–8.1)

## 2015-05-17 LAB — CALCIUM: CALCIUM: 9.3 mg/dL (ref 8.9–10.3)

## 2015-05-17 LAB — CREATININE, SERUM
Creatinine, Ser: 0.88 mg/dL (ref 0.44–1.00)
GFR calc non Af Amer: >60 mL/min (ref >60)

## 2015-05-17 LAB — PHOSPHORUS: Phosphorus: 3.5 mg/dL (ref 2.5–4.6)

## 2015-05-17 MED ORDER — MECLIZINE HCL 32 MG PO TABS: 32.0000 mg | ORAL_TABLET | Freq: Three times a day (TID) | ORAL | Status: DC | PRN

## 2015-05-17 MED ORDER — IOHEXOL 300 MG/ML  SOLN
75.0000 mL | Freq: Once | INTRAMUSCULAR | Status: AC | PRN
  Administered 2015-05-17: 75 mL via INTRAVENOUS

## 2015-05-17 MED ORDER — HEPARIN SOD (PORK) LOCK FLUSH 100 UNIT/ML IV SOLN
500.0000 [IU] | Freq: Once | INTRAVENOUS | Status: AC
  Administered 2015-05-17: 500 [IU] via INTRAVENOUS
  Filled 2015-05-17: qty 5

## 2015-05-17 MED ORDER — MECLIZINE HCL 25 MG PO TABS: 25.0000 mg | ORAL_TABLET | Freq: Three times a day (TID) | ORAL | Status: DC | PRN

## 2015-05-17 NOTE — Progress Notes (Signed)
Pt had went to ER due to nausea, vomiting, vertigo, cold sweat and was told that ct scan showed mets to brain on right side of brain, she still has HA every day on left side where the orbit met was. She does have vision issues but eye doctor was satisfied with the tumor at orbit. Pt nauseated and not been eating much.

## 2015-05-17 NOTE — Progress Notes (Signed)
Radiation Oncology Follow up Note  Name: Terri Marks   Date:   05/17/2015 MRN:  [id number] DOB: 1956-05-14    This 59 y.o. female presents to the clinic today for stage IV adenocarcinoma the lung now with brain metastasis.  REFERRING PROVIDER: No ref. provider found  HPI: patient is a 59 year old female originally consult in 1 year prior when she presented with stage IV adenocarcinoma the left lung with left orbital metastasis..patient has been on targeted therapy and is done fairly well.she is recent complained of vertigo has had some intermittent headaches which he attributes to the left orbital metastasis although recently seen in emergency room where his CT scan showed consolidation left upper lobe being stable but a new diffuse micro-nodularity throughout the lungs consistent with progressive metastatic disease. Her bone metastasis is grossly stable.CT scan of the head with contrast shows interval development of multiple hyperdense foci throughout the supratentorial and infratentorial brain largest measuring 5 mm consistent with metastatic disease. I've asked to evaluate the patient for possible palliative radiation therapy to her whole brain. She specifically denies cough bone pain at this time. She states her vertigo is intermittent and is stable at this time. She has no change in visual fields.  COMPLICATIONS OF TREATMENT: none  FOLLOW UP COMPLIANCE: keeps appointments   PHYSICAL EXAM:  There were no vitals taken for this visit. Cranial nerves II through XII are grossly intact. Motor sensory and DTR levels are equal and symmetric in the upper lower extremities. No focal sensory or neurologic level is appreciated. Crude visual fields are within normal range. She has bilateral cataracts. Proprioception is intact. Well-developed well-nourished patient in NAD. HEENT reveals PERLA, EOMI, discs not visualized.  Oral cavity is clear. No oral mucosal lesions are identified. Neck is clear without  evidence of cervical or supraclavicular adenopathy. Lungs are clear to A&P. Cardiac examination is essentially unremarkable with regular rate and rhythm without murmur rub or thrill. Abdomen is benign with no organomegaly or masses noted. Motor sensory and DTR levels are equal and symmetric in the upper and lower extremities. Cranial nerves II through XII are grossly intact. Proprioception is intact. No peripheral adenopathy or edema is identified. No motor or sensory levels are noted. Crude visual fields are within normal range.   RADIOLOGY RESULTS: CT scans of chest as well as brain CT are all reviewed compatible with the above-stated findings  PLAN: at this time I have recommended whole brain radiation therapy. We'll try to avoid previous treatment fields to the orbit. I plan on delivering 3000 cGy in 10 fractions. I have set up and ordered CT simulation for this week. Risks and benefits of treatment including possible cognitive decline, skin reaction, hair loss, fatigue, and alteration of blood counts were all discussed in detail with the patient. She seems to comprehend my treatment plan well. I've discussed the case personally with medical oncology. She will continue with or targeted therapy at this time.  I would like to take this opportunity for allowing me to participate in the care of your patient.Armstead Peaks., MD

## 2015-05-17 NOTE — Telephone Encounter (Signed)
Escribed 25 mg tabs per PO Dr Ma Hillock

## 2015-05-18 ENCOUNTER — Inpatient Hospital Stay: Payer: 59

## 2015-05-18 VITALS — BP 129/83 | HR 82 | Temp 96.5°F | Resp 18

## 2015-05-18 DIAGNOSIS — Z510 Encounter for antineoplastic radiation therapy: Secondary | ICD-10-CM | POA: Diagnosis present

## 2015-05-18 DIAGNOSIS — C3492 Malignant neoplasm of unspecified part of left bronchus or lung: Secondary | ICD-10-CM

## 2015-05-18 DIAGNOSIS — C7951 Secondary malignant neoplasm of bone: Secondary | ICD-10-CM | POA: Diagnosis not present

## 2015-05-18 DIAGNOSIS — C7931 Secondary malignant neoplasm of brain: Secondary | ICD-10-CM | POA: Diagnosis not present

## 2015-05-18 MED ORDER — DENOSUMAB 120 MG/1.7ML ~~LOC~~ SOLN
120.0000 mg | Freq: Once | SUBCUTANEOUS | Status: AC
  Administered 2015-05-18: 120 mg via SUBCUTANEOUS
  Filled 2015-05-18: qty 1.7

## 2015-05-19 ENCOUNTER — Encounter
Admission: RE | Admit: 2015-05-19 | Discharge: 2015-05-19 | Disposition: A | Discharge status: Home / Self Care | Payer: 59 | Source: Ambulatory Visit | Attending: Internal Medicine | Admitting: Internal Medicine

## 2015-05-19 ENCOUNTER — Other Ambulatory Visit: Payer: Self-pay | Admitting: Internal Medicine

## 2015-05-19 ENCOUNTER — Ambulatory Visit
Admission: RE | Admit: 2015-05-19 | Discharge: 2015-05-19 | Disposition: A | Discharge status: Home / Self Care | Payer: 59 | Source: Ambulatory Visit | Attending: Radiation Oncology | Admitting: Radiation Oncology

## 2015-05-19 DIAGNOSIS — C3492 Malignant neoplasm of unspecified part of left bronchus or lung: Secondary | ICD-10-CM

## 2015-05-19 DIAGNOSIS — Z510 Encounter for antineoplastic radiation therapy: Secondary | ICD-10-CM | POA: Diagnosis not present

## 2015-05-19 MED ORDER — TECHNETIUM TC 99M MEDRONATE IV KIT
24.1000 | PACK | Freq: Once | INTRAVENOUS | Status: AC | PRN
Start: 2015-05-19 — End: 2015-05-19
  Administered 2015-05-19: 24.1 via INTRAVENOUS

## 2015-05-19 NOTE — Progress Notes (Incomplete)
[place]  [phone] Fax:(336) [phone]     ID: Terri Marks OB: 1956-01-03  MR#: [id number]  TDH#:[id number]  Patient Care Team: No Pcp Per Patient as PCP - General (General Practice)  DATE OF SERVICE: May 17, 2015  CHIEF COMPLAINT/DIAGNOSIS:    HISTORY OF PRESENT ILLNESS:  Patient is continued oncology follow-up, she was seen few months ago. More recently she has been on second line ALK inhibitor's [facility]  REVIEW OF SYSTEMS:   ROS As in HPI above. In addition, no fever, chills or sweats. No new focal weakness.  No new sore throat or dysphagia. No chest pain or palpitation. No abdominal pain, constipation, diarrhea, dysuria or hematuria. No new skin rash or bleeding symptoms. No new paresthesias in extremities. No polyuria polydipsia. PS ECOG 1.    PAST MEDICAL HISTORY: Reviewed. Past Medical History  Diagnosis Date  . Diabetes mellitus without complication   . Wears glasses   . Arthritis   . Family history of anesthesia complication     sister hard to put under  . Non-small cell carcinoma of left lung, stage 4 03/22/2015    PAST SURGICAL HISTORY: Reviewed. Past Surgical History  Procedure Laterality Date  . Abdominal hysterectomy      partial  . Cesarean section      x2  . Dorsal compartment release Right 06/05/2013    Procedure: RELEASE RIGHT FIRST DORSAL COMPARTMENT (DEQUERVAIN);  Surgeon: Cammie Sickle., MD;  Location: [facility];  Service: Orthopedics;  Laterality: Right;    FAMILY HISTORY: Reviewed. No family history on file.  SOCIAL HISTORY: Reviewed. History  Substance Use Topics  . Smoking status: Never Smoker   . Smokeless tobacco: Not on file  . Alcohol Use: No    Allergies  Allergen Reactions  . Bactrim [Sulfamethoxazole-Trimethoprim]     Gi upset  . Bactrim [Sulfamethoxazole-Trimethoprim]     Gi upset  . Flagyl [Metronidazole] Nausea And Vomiting  . Other     omnicef-diarrhia    Current  Outpatient Prescriptions  Medication Sig Dispense Refill  . dexamethasone (DECADRON) 4 MG tablet Take 4 mg QID for 7 days, then decrease to 4 mg BID for 3 days, then decrease to 4 mg once a day for the remaining 4 days 45 tablet 0  . glipiZIDE (GLUCOTROL) 5 MG tablet Take 5 mg by mouth 2 (two) times daily before a meal.    . HYDROcodone-acetaminophen (NORCO/VICODIN) 5-325 MG per tablet 1 - 2  Tablets every 4 hours as needed 30 tablet 0  . meclizine (ANTIVERT) 25 MG tablet Take 1 tablet (25 mg total) by mouth 3 (three) times daily as needed for dizziness. 30 tablet 0  . ondansetron (ZOFRAN ODT) 4 MG disintegrating tablet Take 1 tablet (4 mg total) by mouth every 8 (eight) hours as needed for nausea or vomiting. 30 tablet 0   No current facility-administered medications for this visit.    PHYSICAL EXAM:  ECOG FS:1 - Symptomatic but completely ambulatory GENERAL: Patient is alert and oriented and in no acute distress. There is no icterus. HEENT: EOMs intact. Oral exam negative for thrush or lesions. No cervical lymphadenopathy. CVS: S1S2, regular LUNGS: Bilaterally clear to auscultation, no rhonchi. ABDOMEN: Soft, nontender.    NEURO: grossly nonfocal, cranial nerves are intact. Gait unremarkable. EXTREMITIES: No pedal edema.   LAB RESULTS:    Component Value Date/Time   NA 138 05/15/2015 0830   NA 134* 02/22/2015 1335   K 3.5 05/15/2015 0830  K 3.8 02/22/2015 1335   CL 103 05/15/2015 0830   CL 100* 02/22/2015 1335   CO2 25 05/15/2015 0830   CO2 26 02/22/2015 1335   GLUCOSE 324* 05/15/2015 0830   GLUCOSE 357* 02/22/2015 1335   BUN 12 05/15/2015 0830   BUN 13 02/22/2015 1335   CREATININE 0.88 05/17/2015 0918   CREATININE 0.97 02/22/2015 1335   CALCIUM 9.3 05/17/2015 0918   CALCIUM 9.4 02/22/2015 1335   PROT 8.2* 05/17/2015 0918   PROT 7.2 02/22/2015 1335   ALBUMIN 4.5 05/17/2015 0918   ALBUMIN 4.1 02/22/2015 1335   AST 17 05/17/2015 0918   AST 16 02/22/2015 1335   ALT 15  05/17/2015 0918   ALT 14 02/22/2015 1335   ALKPHOS 80 05/17/2015 0918   ALKPHOS 74 02/22/2015 1335   BILITOT 0.6 05/17/2015 0918   BILITOT 0.4 02/22/2015 1335   GFRNONAA >60 05/17/2015 0918   GFRNONAA >60 02/22/2015 1335   GFRAA >60 05/17/2015 0918   GFRAA >60 02/22/2015 1335   Lab Results  Component Value Date   WBC 6.4 05/17/2015   NEUTROABS 5.8 05/17/2015   HGB 14.1 05/17/2015   HCT 42.5 05/17/2015   MCV 88.2 05/17/2015   PLT 218 05/17/2015     STUDIES: Ct Head Wo Contrast  05/15/2015   CLINICAL DATA:  59 year old female with a history of vertigo. Known small cell carcinoma.  EXAM: CT HEAD WITHOUT CONTRAST  TECHNIQUE: Contiguous axial images were obtained from the base of the skull through the vertex without intravenous contrast.  COMPARISON:  MRI 06/09/2014  FINDINGS: No fracture identified. Sclerotic focus at the clivus measuring 7 mm. It is questionable sclerotic focus of the right sphenoid.  Unremarkable appearance of the scalp soft tissues.  Unremarkable appearance of the bilateral orbits.  Mastoid air cells are clear.  No significant paranasal sinus disease  As compared to prior MRI study there has been interval development of multiple hyperdense foci throughout all lobes of the bilateral hemispheres, as well as the bilateral cerebellum. Largest focus in the right cerebellum measures 7 mm. Largest supratentorial focus is in the right frontal lobe measuring approximately 5 mm. No midline shift.  No acute intracranial hemorrhage.  Unremarkable configuration of the ventricular system.  Gray-white differentiation is relatively maintained without significant cytotoxic edema.  IMPRESSION: Interval development of multiple hyperdense foci throughout the supratentorial and infratentorial brain with the largest in the right frontal lobe measuring 5 mm in the largest in the right cerebellum measuring 7 mm. 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 Further  evaluation with MRI is recommended.  No midline shift, significant mass effect, or significant cytotoxic edema is present on the current study.  Sclerotic focus in the clivus as well as the right sphenoid, suggesting calvarial metastases.  These results were called by telephone at the time of interpretation on 05/15/2015 at 9:34 am to Dr. Lavonia Drafts , who verbally acknowledged these results.  Signed,  Dulcy Fanny. Earleen Newport, DO  [facility]  [facility]   Electronically Signed   By: Corrie Mckusick D.O.   On: 05/15/2015 09:34   Ct Chest W Contrast  05/17/2015   CLINICAL DATA:  Followup lung cancer, metastatic to brain and bones. History of chemotherapy and radiation therapy.  EXAM: CT CHEST WITH CONTRAST  TECHNIQUE: Multidetector CT imaging of the chest was performed during intravenous contrast administration.  CONTRAST:  60mL OMNIPAQUE IOHEXOL 300 MG/ML  SOLN  COMPARISON:  Bone scan 01/14/2015  and CT chest 01/07/2015.  FINDINGS: Mediastinum/Nodes: Right IJ Port-A-Cath terminates in the low SVC. Subcarinal lymph node measures 1.3 cm, possibly new. No additional mediastinal, right hilar or axillary adenopathy. Heart size normal. No pericardial effusion.  Lungs/Pleura: Masslike collapse/ consolidation with internal calcification in the left upper lobe is unchanged. Obstruction of the distal left upper lobe bronchus. Diffuse micro nodularity is seen in a hematogenous distribution with the largest nodule measuring approximately 3 mm in the right lower lobe. Findings are new from prior exams. No pleural fluid. Airway is otherwise unremarkable.  Upper abdomen: Heterogeneous low-attenuation lesion in the left hepatic lobe measures 2.8 x 3.6 cm is stable and indicative of a hemangioma, when compared with prior exams. Visualized portions of the liver, gallbladder, adrenal glands and kidneys are otherwise unremarkable. 5 mm low-attenuation lesion in the peripheral aspect of the  spleen is too small to characterize. Visualized portions of the pancreas, stomach and bowel are grossly unremarkable.  Musculoskeletal: Multiple scattered sclerotic lesions are seen predominantly in the spine, grossly stable from 12/16/2014.  IMPRESSION: 1. Masslike collapse/consolidation in the left upper lobe is stable from 01/07/2015. New diffuse micro nodularity throughout the lungs is most consistent with progressive metastatic disease. 2. Osseous metastatic disease is grossly stable.   Electronically Signed   By: Lorin Picket M.D.   On: 05/17/2015 11:25   Nm Bone Scan Whole Body  05/19/2015   CLINICAL DATA:  Stage IV lung malignancy, bilateral shoulder back and hip pain.  EXAM: NUCLEAR MEDICINE WHOLE BODY BONE SCAN  TECHNIQUE: Whole body anterior and posterior images were obtained approximately 3 hours after intravenous injection of radiopharmaceutical.  RADIOPHARMACEUTICALS:  24.1 mCi Technetium-101m MDP IV  COMPARISON:  Nuclear bone scan of January 14, 2015  FINDINGS: There is adequate uptake of the radiopharmaceutical by the skeleton. There is adequate soft tissue clearance and renal activity.  There is increased calvarial uptake in the left posterior parietal bone and in the midline frontal bone. There is stable mildly increased uptake in the mid cervical spine. In the thoracic spine mildly increased uptake in the costo vertebral junctions is present and stable. In the lumbar spine there is stable mild increased uptake in the L3 and L4 vertebral bodies. There is stable increased uptake in the posterior aspect of the left fifth rib posteriorly and left second rib anteriorly.  There is persistent increased uptake in the right humeral neck and to a lesser extent left humeral neck. There is persistent increased uptake in the proximal humeral shafts bilaterally more conspicuous today. In the pelvis increased uptake associated with the right sacral ala is again demonstrated. Focal increased uptake in the  posterior lateral aspect of the inferior pubic ramus on the right is not evident today.  Mildly increased uptake in the ischium on the left is present and stable. Persistent increased uptake in the intertrochanteric -subtrochanteric region of the left hip. Activity in the distal third of the right femoral shaft remains mildly increased.  IMPRESSION: 1. New increased uptake in the calvarium is demonstrated today worrisome for metastatic disease peer. 2. Persistent areas of abnormally increased uptake in the proximal humeri, ribs, mid cervical and mid lumbar spine are noted. There is persistent increased uptake in the intertrochanteric-sub trochanteric region of the left femur and in the distal third of the right femoral shaft. 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   Electronically Signed   By: David  Martinique M.D.   On: 05/19/2015 12:54      STAGING: No  matching staging information was found for the patient.    ASSESSMENT / PLAN:        Leia Alf, MD   05/19/2015 10:25 PM

## 2015-05-20 ENCOUNTER — Ambulatory Visit
Admission: RE | Admit: 2015-05-20 | Discharge: 2015-05-20 | Disposition: A | Discharge status: Home / Self Care | Payer: 59 | Source: Ambulatory Visit | Attending: Radiation Oncology | Admitting: Radiation Oncology

## 2015-05-20 ENCOUNTER — Ambulatory Visit: Payer: 59

## 2015-05-20 DIAGNOSIS — Z510 Encounter for antineoplastic radiation therapy: Secondary | ICD-10-CM | POA: Diagnosis not present

## 2015-05-21 ENCOUNTER — Ambulatory Visit: Payer: 59

## 2015-05-21 ENCOUNTER — Ambulatory Visit
Admission: RE | Admit: 2015-05-21 | Discharge: 2015-05-21 | Disposition: A | Discharge status: Home / Self Care | Payer: 59 | Source: Ambulatory Visit | Attending: Radiation Oncology | Admitting: Radiation Oncology

## 2015-05-21 DIAGNOSIS — Z510 Encounter for antineoplastic radiation therapy: Secondary | ICD-10-CM | POA: Diagnosis not present

## 2015-05-24 ENCOUNTER — Ambulatory Visit
Admission: RE | Admit: 2015-05-24 | Discharge: 2015-05-24 | Disposition: A | Discharge status: Home / Self Care | Payer: 59 | Source: Ambulatory Visit | Attending: Radiation Oncology | Admitting: Radiation Oncology

## 2015-05-24 ENCOUNTER — Telehealth: Payer: Self-pay | Admitting: *Deleted

## 2015-05-24 DIAGNOSIS — Z510 Encounter for antineoplastic radiation therapy: Secondary | ICD-10-CM | POA: Diagnosis not present

## 2015-05-24 MED ORDER — FLUCONAZOLE 150 MG PO TABS: 150.0000 mg | ORAL_TABLET | Freq: Every day | ORAL | Status: DC

## 2015-05-24 NOTE — Telephone Encounter (Signed)
I called pt to let her know that I contacted [facility] since she did not have a DME in mind.  I did verify that they contract with [facility] which is the type of insurance they have.  I faxed all the info over today and the company takes 24 to 48 hours to process before they contact pt.  The patient says that she does not feel well, not able to eat much and feels like it is from streroids and so she is tapering off of them.  I had spoke to her Friday about this and told her the steroids were given because of the mets to her brain to prevent swelling and making the areas affect her more but enhancing in size.  She was suppose to speak with chrystal the radiation doctor to see if he felt that was a good idea.  She chose to wait til tom. That is when she has appt with him eventhough she came into today and got her radiation treatment.  She feels that she has thrush-she has white patches on her tongue and I told her I would ask to see if finnegan on call md would allow me to call diflucan in for her thrush and she was thankful for that.  I called in Diflucan 150 mg take one daily x 3 days per Dr. Grayland Ormond permission it was called into her [facility] and pt was told on the phone it would be called in.

## 2015-05-25 ENCOUNTER — Other Ambulatory Visit: Payer: Self-pay | Admitting: *Deleted

## 2015-05-25 ENCOUNTER — Ambulatory Visit
Admission: RE | Admit: 2015-05-25 | Discharge: 2015-05-25 | Disposition: A | Discharge status: Home / Self Care | Payer: 59 | Source: Ambulatory Visit | Attending: Radiation Oncology | Admitting: Radiation Oncology

## 2015-05-25 DIAGNOSIS — Z510 Encounter for antineoplastic radiation therapy: Secondary | ICD-10-CM | POA: Diagnosis not present

## 2015-05-25 DIAGNOSIS — C7931 Secondary malignant neoplasm of brain: Secondary | ICD-10-CM

## 2015-05-25 DIAGNOSIS — C7949 Secondary malignant neoplasm of other parts of nervous system: Principal | ICD-10-CM

## 2015-05-26 ENCOUNTER — Ambulatory Visit
Admission: RE | Admit: 2015-05-26 | Discharge: 2015-05-26 | Disposition: A | Discharge status: Home / Self Care | Payer: 59 | Source: Ambulatory Visit | Attending: Radiation Oncology | Admitting: Radiation Oncology

## 2015-05-26 DIAGNOSIS — C7931 Secondary malignant neoplasm of brain: Secondary | ICD-10-CM | POA: Insufficient documentation

## 2015-05-26 DIAGNOSIS — C7949 Secondary malignant neoplasm of other parts of nervous system: Principal | ICD-10-CM

## 2015-05-26 DIAGNOSIS — Z510 Encounter for antineoplastic radiation therapy: Secondary | ICD-10-CM | POA: Diagnosis not present

## 2015-05-27 ENCOUNTER — Ambulatory Visit
Admission: RE | Admit: 2015-05-27 | Discharge: 2015-05-27 | Disposition: A | Discharge status: Home / Self Care | Payer: 59 | Source: Ambulatory Visit | Attending: Radiation Oncology | Admitting: Radiation Oncology

## 2015-05-27 DIAGNOSIS — Z510 Encounter for antineoplastic radiation therapy: Secondary | ICD-10-CM | POA: Diagnosis not present

## 2015-05-28 ENCOUNTER — Ambulatory Visit
Admission: RE | Admit: 2015-05-28 | Discharge: 2015-05-28 | Disposition: A | Discharge status: Home / Self Care | Payer: 59 | Source: Ambulatory Visit | Attending: Radiation Oncology | Admitting: Radiation Oncology

## 2015-05-28 ENCOUNTER — Other Ambulatory Visit: Payer: Self-pay | Admitting: *Deleted

## 2015-05-28 ENCOUNTER — Inpatient Hospital Stay: Payer: 59

## 2015-05-28 DIAGNOSIS — C3492 Malignant neoplasm of unspecified part of left bronchus or lung: Secondary | ICD-10-CM

## 2015-05-28 DIAGNOSIS — Z510 Encounter for antineoplastic radiation therapy: Secondary | ICD-10-CM | POA: Diagnosis not present

## 2015-05-28 LAB — CBC WITH DIFFERENTIAL/PLATELET
BASOS PCT: 0 %
Basophils Absolute: 0 10*3/uL (ref 0–0.1)
Eosinophils Absolute: 0.1 10*3/uL (ref 0–0.7)
Eosinophils Relative: 1 %
HCT: 43.6 % (ref 35.0–47.0)
Hemoglobin: 14.2 g/dL (ref 12.0–16.0)
LYMPHS ABS: 1.3 10*3/uL (ref 1.0–3.6)
Lymphocytes Relative: 22 %
MCH: 29.1 pg (ref 26.0–34.0)
MCHC: 32.7 g/dL (ref 32.0–36.0)
MCV: 89.1 fL (ref 80.0–100.0)
MONOS PCT: 7 %
Monocytes Absolute: 0.4 10*3/uL (ref 0.2–0.9)
NEUTROS ABS: 4.2 10*3/uL (ref 1.4–6.5)
Neutrophils Relative %: 70 %
PLATELETS: 177 10*3/uL (ref 150–440)
RBC: 4.89 MIL/uL (ref 3.80–5.20)
RDW: 13 % (ref 11.5–14.5)
WBC: 6 10*3/uL (ref 3.6–11.0)

## 2015-05-31 ENCOUNTER — Ambulatory Visit
Admission: RE | Admit: 2015-05-31 | Discharge: 2015-05-31 | Disposition: A | Discharge status: Home / Self Care | Payer: 59 | Source: Ambulatory Visit | Attending: Radiation Oncology | Admitting: Radiation Oncology

## 2015-05-31 DIAGNOSIS — Z510 Encounter for antineoplastic radiation therapy: Secondary | ICD-10-CM | POA: Diagnosis not present

## 2015-06-01 ENCOUNTER — Ambulatory Visit
Admission: RE | Admit: 2015-06-01 | Discharge: 2015-06-01 | Disposition: A | Discharge status: Home / Self Care | Payer: 59 | Source: Ambulatory Visit | Attending: Radiation Oncology | Admitting: Radiation Oncology

## 2015-06-01 DIAGNOSIS — Z510 Encounter for antineoplastic radiation therapy: Secondary | ICD-10-CM | POA: Diagnosis not present

## 2015-06-02 ENCOUNTER — Ambulatory Visit
Admission: RE | Admit: 2015-06-02 | Discharge: 2015-06-02 | Disposition: A | Discharge status: Home / Self Care | Payer: 59 | Source: Ambulatory Visit | Attending: Radiation Oncology | Admitting: Radiation Oncology

## 2015-06-02 ENCOUNTER — Ambulatory Visit: Payer: 59

## 2015-06-02 DIAGNOSIS — Z510 Encounter for antineoplastic radiation therapy: Secondary | ICD-10-CM | POA: Diagnosis not present

## 2015-06-03 ENCOUNTER — Ambulatory Visit: Payer: 59

## 2015-06-04 ENCOUNTER — Ambulatory Visit: Payer: 59

## 2015-06-05 NOTE — Progress Notes (Signed)
Terri Marks  [phone] [phone]     ID: Terri Marks OB: [birth year]  MR#: [id number]  YYQ#:[id number]  Patient Care Team: No Pcp Per Patient as PCP - General (General Practice)  CHIEF COMPLAINT/DIAGNOSIS:  Stage IV non-small cell lung cancer with bone metastasis, adenocarcinoma (presented with multiple abnormal bone lesions seen on CT scan, and left lung mass). Left lung mass biopsy 04/16/14 -  ADENOCARCINOMA,ACINAR AND MICROPAPILLARY PATTERNS. The adenocarcinoma present is morphologically consistent with a lung primary. ALK mutation positive.  Started palliative chemotherapy with Taxol/Carboplatin/Avastin on 05/12/14. Started Zometa for bone metastases on 04/24/14. Started palliative ALK inhibitor Xalkori around 4th week of November 2015. Discontinued Xalkori 01/06/15 due to side effects.  Started Zykadia (Ceritinib) around 02/17/15, but taking only 3 tablets (450 mg) daily.   HISTORY OF PRESENT ILLNESS:  Patient returns for continued oncology follow-up and get Xgeva treatment. States that she stopped taking Zykadia around Mother's Day on her own since she felt that it made her feel tired. Has persistent left shoulder pain and is concerned if this is from bone metastases. She also avoids taking pain medication. Still has intermittent vision symptoms. She also c/o persistent vertigo. Denies any muscle weakness in lower extremities, fecal or urinary retention or incontinence. She continues to have intermittent dyspnea on exertion and cough but no hemoptysis or chest pain. No fever or chills. No nausea or vomiting.  REVIEW OF SYSTEMS:   ROS As in HPI above. In addition, no fever, chills or sweats. No new headaches or focal weakness.  No new sore throat or dysphagia. No abdominal pain, constipation, diarrhea, dysuria or hematuria. No new skin rash or bleeding symptoms. No new paresthesias in extremities. No polyuria polydipsia. PS ECOG 1.  PAST MEDICAL HISTORY:  Reviewed. Past Medical History  Diagnosis Date  . Diabetes mellitus without complication   . Wears glasses   . Arthritis   . Family history of anesthesia complication     sister hard to put under  . Non-small cell carcinoma of left lung, stage 4 03/22/2015         Thyroid nodule  Dyslipidemia  Migraines  Borderline diabetes  Scoliosis  History of recurrent sinus infection  Blood transfusion before 1992  Partial hysterectomy  C-section  PAST SURGICAL HISTORY: Reviewed. Past Surgical History  Procedure Laterality Date  . Abdominal hysterectomy      partial  . Cesarean section      x2  . Dorsal compartment release Right 06/05/2013    Procedure: RELEASE RIGHT FIRST DORSAL COMPARTMENT (DEQUERVAIN);  Surgeon: Cammie Sickle., MD;  Location: [facility];  Service: Orthopedics;  Laterality: Right;    FAMILY HISTORY: Reviewed. Sister with rheumatoid arthritis. Grandmother had breast cancer at age 31. Denies other malignancies  SOCIAL HISTORY: Reviewed. History  Substance Use Topics  . Smoking status: Never Smoker   . Smokeless tobacco: Not on file  . Alcohol Use: No  Denies history of smoking. Occasional alcohol intake. Denies recreational drug usage.  Allergies  Allergen Reactions  . Bactrim [Sulfamethoxazole-Trimethoprim]     Gi upset  . Bactrim [Sulfamethoxazole-Trimethoprim]     Gi upset  . Flagyl [Metronidazole] Nausea And Vomiting  . Other     omnicef-diarrhia    Current Outpatient Prescriptions  Medication Sig Dispense Refill  . glipiZIDE (GLUCOTROL) 5 MG tablet Take 5 mg by mouth 2 (two) times daily before a meal.    . HYDROcodone-acetaminophen (NORCO/VICODIN) 5-325 MG per tablet 1 - 2  Tablets every 4 hours as needed 30 tablet 0  . ondansetron (ZOFRAN ODT) 4 MG disintegrating tablet Take 1 tablet (4 mg total) by mouth every 8 (eight) hours as needed for nausea or vomiting. 30 tablet 0  . fluconazole (DIFLUCAN) 150 MG tablet Take 1 tablet (150 mg  total) by mouth daily. 3 tablet 0  . meclizine (ANTIVERT) 25 MG tablet Take 1 tablet (25 mg total) by mouth 3 (three) times daily as needed for dizziness. 30 tablet 0   No current facility-administered medications for this visit.    PHYSICAL EXAM: Filed Vitals:   05/17/15 1221  BP: 134/78  Pulse: 80  Temp: 97.9 F (36.6 C)  Resp: 18     Body mass index is 24.19 kg/(m^2).    ECOG FS:1 - Symptomatic but completely ambulatory  GENERAL: Patient is alert and oriented and in no acute distress. There is no icterus. HEENT: EOMs intact. Oral exam negative for thrush or lesions. No cervical lymphadenopathy. CVS: S1S2, regular LUNGS: Bilaterally good air entry, no rhonchi. ABDOMEN: Soft, nontender.   NEURO: grossly nonfocal, cranial nerves are intact. Gait unremarkable. EXTREMITIES: No pedal edema.   LAB RESULTS:    Component Value Date/Time   NA 138 05/15/2015 0830   NA 134* 02/22/2015 1335   K 3.5 05/15/2015 0830   K 3.8 02/22/2015 1335   CL 103 05/15/2015 0830   CL 100* 02/22/2015 1335   CO2 25 05/15/2015 0830   CO2 26 02/22/2015 1335   GLUCOSE 324* 05/15/2015 0830   GLUCOSE 357* 02/22/2015 1335   BUN 12 05/15/2015 0830   BUN 13 02/22/2015 1335   CREATININE 0.88 05/17/2015 0918   CREATININE 0.97 02/22/2015 1335   CALCIUM 9.3 05/17/2015 0918   CALCIUM 9.4 02/22/2015 1335   PROT 8.2* 05/17/2015 0918   PROT 7.2 02/22/2015 1335   ALBUMIN 4.5 05/17/2015 0918   ALBUMIN 4.1 02/22/2015 1335   AST 17 05/17/2015 0918   AST 16 02/22/2015 1335   ALT 15 05/17/2015 0918   ALT 14 02/22/2015 1335   ALKPHOS 80 05/17/2015 0918   ALKPHOS 74 02/22/2015 1335   BILITOT 0.6 05/17/2015 0918   BILITOT 0.4 02/22/2015 1335   GFRNONAA >60 05/17/2015 0918   GFRNONAA >60 02/22/2015 1335   GFRAA >60 05/17/2015 0918   GFRAA >60 02/22/2015 1335    STUDIES: Dg Shoulder Right  05/26/2015   CLINICAL DATA:  Bilateral shoulder pain, worse on left side. Non-small cell carcinoma of left lung.  Metastatic disease?  EXAM: RIGHT SHOULDER - 2+ VIEW  COMPARISON:  None.  FINDINGS: Three views of the right shoulder are provided. There is a poorly defined sclerotic focus within the right humeral head/neck measuring 1.8 cm greatest dimension, highly suggestive of a blastic metastatic lesion.  No fracture line or displaced fracture fragment identified within the humeral head or neck. Right humeral head is well positioned relative to the glenoid fossa. Overlying acromioclavicular joint space appears well preserved and well aligned. Visualized portions of the adjacent right upper ribs appear intact and normal in mineralization.  IMPRESSION: 1.8 cm sclerotic focus within the right humeral head/ neck region highly suspicious for blastic metastatic lesion.   Electronically Signed   By: Franki Cabot M.D.   On: 05/26/2015 10:26   Ct Head Wo Contrast  05/15/2015   CLINICAL DATA:  59 year old female with a history of vertigo. Known small cell carcinoma.  EXAM: CT HEAD WITHOUT CONTRAST  TECHNIQUE: Contiguous axial images were obtained from the base of  the skull through the vertex without intravenous contrast.  COMPARISON:  MRI 06/09/2014  FINDINGS: No fracture identified. Sclerotic focus at the clivus measuring 7 mm. It is questionable sclerotic focus of the right sphenoid.  Unremarkable appearance of the scalp soft tissues.  Unremarkable appearance of the bilateral orbits.  Mastoid air cells are clear.  No significant paranasal sinus disease  As compared to prior MRI study there has been interval development of multiple hyperdense foci throughout all lobes of the bilateral hemispheres, as well as the bilateral cerebellum. Largest focus in the right cerebellum measures 7 mm. Largest supratentorial focus is in the right frontal lobe measuring approximately 5 mm. No midline shift.  No acute intracranial hemorrhage.  Unremarkable configuration of the ventricular system.  Gray-white differentiation is relatively maintained  without significant cytotoxic edema.  IMPRESSION: Interval development of multiple hyperdense foci throughout the supratentorial and infratentorial brain with the largest in the right frontal lobe measuring 5 mm in the largest in the right cerebellum measuring 7 mm. 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 Further evaluation with MRI is recommended.  No midline shift, significant mass effect, or significant cytotoxic edema is present on the current study.  Sclerotic focus in the clivus as well as the right sphenoid, suggesting calvarial metastases.  These results were called by telephone at the time of interpretation on 05/15/2015 at 9:34 am to Dr. Lavonia Drafts , who verbally acknowledged these results.  Signed,  Dulcy Fanny. Earleen Newport, DO  [facility]  [facility]   Electronically Signed   By: Corrie Mckusick D.O.   On: 05/15/2015 09:34   Ct Chest W Contrast  05/17/2015   CLINICAL DATA:  Followup lung cancer, metastatic to brain and bones. History of chemotherapy and radiation therapy.  EXAM: CT CHEST WITH CONTRAST  TECHNIQUE: Multidetector CT imaging of the chest was performed during intravenous contrast administration.  CONTRAST:  7mL OMNIPAQUE IOHEXOL 300 MG/ML  SOLN  COMPARISON:  Bone scan 01/14/2015 and CT chest 01/07/2015.  FINDINGS: Mediastinum/Nodes: Right IJ Port-A-Cath terminates in the low SVC. Subcarinal lymph node measures 1.3 cm, possibly new. No additional mediastinal, right hilar or axillary adenopathy. Heart size normal. No pericardial effusion.  Lungs/Pleura: Masslike collapse/ consolidation with internal calcification in the left upper lobe is unchanged. Obstruction of the distal left upper lobe bronchus. Diffuse micro nodularity is seen in a hematogenous distribution with the largest nodule measuring approximately 3 mm in the right lower lobe. Findings are new from prior exams. No pleural fluid. Airway is otherwise  unremarkable.  Upper abdomen: Heterogeneous low-attenuation lesion in the left hepatic lobe measures 2.8 x 3.6 cm is stable and indicative of a hemangioma, when compared with prior exams. Visualized portions of the liver, gallbladder, adrenal glands and kidneys are otherwise unremarkable. 5 mm low-attenuation lesion in the peripheral aspect of the spleen is too small to characterize. Visualized portions of the pancreas, stomach and bowel are grossly unremarkable.  Musculoskeletal: Multiple scattered sclerotic lesions are seen predominantly in the spine, grossly stable from 12/16/2014.  IMPRESSION: 1. Masslike collapse/consolidation in the left upper lobe is stable from 01/07/2015. New diffuse micro nodularity throughout the lungs is most consistent with progressive metastatic disease. 2. Osseous metastatic disease is grossly stable.   Electronically Signed   By: Lorin Picket M.D.   On: 05/17/2015 11:25   Nm Bone Scan Whole Body  05/19/2015   CLINICAL DATA:  Stage IV lung malignancy, bilateral shoulder back and hip pain.  EXAM: NUCLEAR MEDICINE WHOLE BODY BONE SCAN  TECHNIQUE: Whole body anterior and posterior images were obtained approximately 3 hours after intravenous injection of radiopharmaceutical.  RADIOPHARMACEUTICALS:  24.1 mCi Technetium-9m MDP IV  COMPARISON:  Nuclear bone scan of January 14, 2015  FINDINGS: There is adequate uptake of the radiopharmaceutical by the skeleton. There is adequate soft tissue clearance and renal activity.  There is increased calvarial uptake in the left posterior parietal bone and in the midline frontal bone. There is stable mildly increased uptake in the mid cervical spine. In the thoracic spine mildly increased uptake in the costo vertebral junctions is present and stable. In the lumbar spine there is stable mild increased uptake in the L3 and L4 vertebral bodies. There is stable increased uptake in the posterior aspect of the left fifth rib posteriorly and left second  rib anteriorly.  There is persistent increased uptake in the right humeral neck and to a lesser extent left humeral neck. There is persistent increased uptake in the proximal humeral shafts bilaterally more conspicuous today. In the pelvis increased uptake associated with the right sacral ala is again demonstrated. Focal increased uptake in the posterior lateral aspect of the inferior pubic ramus on the right is not evident today.  Mildly increased uptake in the ischium on the left is present and stable. Persistent increased uptake in the intertrochanteric -subtrochanteric region of the left hip. Activity in the distal third of the right femoral shaft remains mildly increased.  IMPRESSION: 1. New increased uptake in the calvarium is demonstrated today worrisome for metastatic disease peer. 2. Persistent areas of abnormally increased uptake in the proximal humeri, ribs, mid cervical and mid lumbar spine are noted. There is persistent increased uptake in the intertrochanteric-sub trochanteric region of the left femur and in the distal third of the right femoral shaft. 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   Electronically Signed   By: David  Martinique M.D.   On: 05/19/2015 12:54   Dg Shoulder Left  05/26/2015   CLINICAL DATA:  BILATERAL shoulder pain. History of non-small-cell lung cancer.  EXAM: LEFT SHOULDER - 2+ VIEW  COMPARISON:  Bone scan 05/19/2015.  FINDINGS: No glenohumeral dislocation. Mild sclerosis and slight lucency in the humeral neck on the AP view could represent early plain film signs of metastatic disease. No pathologic fracture is evident. No cortical destruction. Soft tissues unremarkable. No abnormal calcifications.  IMPRESSION: No pathologic fracture. No glenohumeral dislocation. Subtle plain film signs of sclerosis and lucency in the humeral neck correlate with the scintigraphic findings of suspected metastatic disease.   Electronically Signed   By: Staci Righter M.D.   On:  05/26/2015 10:23   Dg Femur Min 2 Views Left  05/26/2015   CLINICAL DATA:  Left hip pain, metastatic lung malignancy  EXAM: LEFT FEMUR 2 VIEWS  COMPARISON:  Nuclear bone scan dated May 19, 2015  FINDINGS: There is subtle increased density in the immediate sub trochanteric region of the left femur which corresponds to the bone scan abnormality. The femoral head and CT exhibit no acute abnormalities. The joint space is preserved the femoral shaft distally exhibits no significant abnormality. The observed portions of the knee are unremarkable.  IMPRESSION: There is a subtle sclerotic lesion in the immediate sub trochanteric region of the left femur which corresponds to the abnormal uptake on the recent nuclear bone scan. This is compatible with metastatic disease.   Electronically Signed   By: David  Martinique M.D.   On: 05/26/2015 10:20  ASSESSMENT / PLAN:   1. ALK positive stage IV non-small cell lung cancer with bone metastasis, adenocarcinoma (presented with multiple bone lesions seen on CT scan, and left lung mass). Diagnosed by left lung mass biopsy 04/16/14. ALK mutation positive. Got palliative chemo ( Taxol/Carboplatin/Avastin) and completed 4 cycles on 08/04/14. Then CT chest showed response to chemo but still had residual disease especially in left upper lung. Started palliative ALK inhibitor Xalkori in November 2015. Discontinued Xalkori 01/06/15 due to side effects. Started Zykadia (Ceritinib) around 02/17/15 at 3 tablets (450 mg) daily but decided to stop it on her own after few days only -   reviewed labs from today and discussed with patient. She does not want to take [facility] or any other systemic chemo at this time. Will get bone scan and if she had progressive left shoulder lesion will request Dr.Chrystal to see if palliative radiation would help pain control. Will refer to ENT for vertigo. Have explained that prognosis is poor and discussed treatment options but she prefers to hold off and see  if she feels better overall. Will see her back in 3 weeks and re-discuss treatment options.      2. Bone metastasis, on bisphosphonate therapy - patient refuses to continue taking Xgeva despite explaining benefits. Will discuss again in 3 weeks.       3. Pain - she did not start fentanyl patch prescribed upon previous visit. Also hesitant to take frequent Norco as needed, I have explained about taking adequate pain medication to achieve pain control, states that she will try.      4. Anemia - mild, no progressive symptoms. Continue to monitor. 5. In between visits, patient advised to call or come to the ER in case of any new symptoms or acute sickness. She is agreeable to this plan.    Leia Alf, MD   06/05/2015 4:25 PM

## 2015-06-07 ENCOUNTER — Inpatient Hospital Stay: Payer: 59 | Admitting: Internal Medicine

## 2015-06-07 ENCOUNTER — Inpatient Hospital Stay: Payer: 59

## 2015-06-09 ENCOUNTER — Inpatient Hospital Stay (HOSPITAL_BASED_OUTPATIENT_CLINIC_OR_DEPARTMENT_OTHER): Payer: 59 | Admitting: Internal Medicine

## 2015-06-09 ENCOUNTER — Inpatient Hospital Stay: Payer: 59

## 2015-06-09 ENCOUNTER — Inpatient Hospital Stay: Payer: 59 | Attending: Internal Medicine

## 2015-06-09 VITALS — BP 112/78 | HR 112 | Temp 97.3°F | Wt 125.7 lb

## 2015-06-09 DIAGNOSIS — C3492 Malignant neoplasm of unspecified part of left bronchus or lung: Secondary | ICD-10-CM

## 2015-06-09 DIAGNOSIS — Z79899 Other long term (current) drug therapy: Secondary | ICD-10-CM

## 2015-06-09 DIAGNOSIS — R531 Weakness: Secondary | ICD-10-CM | POA: Insufficient documentation

## 2015-06-09 DIAGNOSIS — C7951 Secondary malignant neoplasm of bone: Secondary | ICD-10-CM

## 2015-06-09 DIAGNOSIS — R112 Nausea with vomiting, unspecified: Secondary | ICD-10-CM | POA: Insufficient documentation

## 2015-06-09 DIAGNOSIS — E119 Type 2 diabetes mellitus without complications: Secondary | ICD-10-CM | POA: Insufficient documentation

## 2015-06-09 LAB — COMPREHENSIVE METABOLIC PANEL
ALT: 14 U/L (ref 14–54)
ANION GAP: 7 (ref 5–15)
AST: 12 U/L — ABNORMAL LOW (ref 15–41)
Albumin: 3.7 g/dL (ref 3.5–5.0)
Alkaline Phosphatase: 85 U/L (ref 38–126)
BILIRUBIN TOTAL: 0.7 mg/dL (ref 0.3–1.2)
BUN: 10 mg/dL (ref 6–20)
CHLORIDE: 96 mmol/L — AB (ref 101–111)
CO2: 29 mmol/L (ref 22–32)
CREATININE: 0.84 mg/dL (ref 0.44–1.00)
Calcium: 8.6 mg/dL — ABNORMAL LOW (ref 8.9–10.3)
GFR calc Af Amer: >60 mL/min (ref >60)
Glucose, Bld: 473 mg/dL — ABNORMAL HIGH (ref 65–99)
POTASSIUM: 3.7 mmol/L (ref 3.5–5.1)
Sodium: 132 mmol/L — ABNORMAL LOW (ref 135–145)
Total Protein: 7 g/dL (ref 6.5–8.1)

## 2015-06-09 LAB — CBC WITH DIFFERENTIAL/PLATELET
Basophils Absolute: 0 10*3/uL (ref 0–0.1)
Basophils Relative: 0 %
Eosinophils Absolute: 0 10*3/uL (ref 0–0.7)
Eosinophils Relative: 1 %
HEMATOCRIT: 39.9 % (ref 35.0–47.0)
Hemoglobin: 13.4 g/dL (ref 12.0–16.0)
LYMPHS ABS: 0.6 10*3/uL — AB (ref 1.0–3.6)
Lymphocytes Relative: 24 %
MCH: 29.6 pg (ref 26.0–34.0)
MCHC: 33.6 g/dL (ref 32.0–36.0)
MCV: 88 fL (ref 80.0–100.0)
MONO ABS: 0.2 10*3/uL (ref 0.2–0.9)
MONOS PCT: 8 %
NEUTROS ABS: 1.8 10*3/uL (ref 1.4–6.5)
NEUTROS PCT: 67 %
Platelets: 155 10*3/uL (ref 150–440)
RBC: 4.54 MIL/uL (ref 3.80–5.20)
RDW: 13.1 % (ref 11.5–14.5)
WBC: 2.7 10*3/uL — ABNORMAL LOW (ref 3.6–11.0)

## 2015-06-09 MED ORDER — DENOSUMAB 120 MG/1.7ML ~~LOC~~ SOLN
120.0000 mg | Freq: Once | SUBCUTANEOUS | Status: AC
  Administered 2015-06-09: 120 mg via SUBCUTANEOUS
  Filled 2015-06-09: qty 1.7

## 2015-06-09 MED ORDER — METOCLOPRAMIDE HCL 5 MG/ML IJ SOLN: 10.0000 mg | Freq: Once | INTRAMUSCULAR | Status: DC

## 2015-06-09 MED ORDER — METOCLOPRAMIDE HCL 5 MG/ML IJ SOLN
10.0000 mg | Freq: Once | INTRAMUSCULAR | Status: AC
  Administered 2015-06-09: 10 mg via INTRAMUSCULAR
  Filled 2015-06-09: qty 2

## 2015-06-11 ENCOUNTER — Telehealth: Payer: Self-pay | Admitting: *Deleted

## 2015-06-11 NOTE — Telephone Encounter (Signed)
[facility] went out to open patient to services, she declined services stating she is not ready for them and will let them if or when she wants them

## 2015-06-21 NOTE — Progress Notes (Signed)
[place]  [phone] Fax:(336) [phone]     ID: Terri Marks OB: September 11, 1956  MR#: [id number]  MWN#:[id number]  Patient Care Team: No Pcp Per Patient as PCP - General (General Practice)  CHIEF COMPLAINT/DIAGNOSIS:  Stage IV non-small cell lung cancer with bone metastasis, adenocarcinoma (presented with multiple abnormal bone lesions seen on CT scan, and left lung mass). Left lung mass biopsy 04/16/14 - ADENOCARCINOMA,ACINAR AND MICROPAPILLARY PATTERNS. The adenocarcinoma present is morphologically consistent with a lung primary. ALK mutation positive.  Started palliative chemotherapy with Taxol/Carboplatin/Avastin on 05/12/14. Started Zometa for bone metastases on 04/24/14. Started palliative ALK inhibitor Xalkori around 4th week of November 2015. Discontinued Xalkori 01/06/15 due to side effects.  Started Zykadia (Ceritinib) around 02/17/15, but taking only 3 tablets (450 mg) daily. Patient discontinued on her own after taking for a few days, decided to stop cancer treatments.          HISTORY OF PRESENT ILLNESS:  Patient returns for continued oncology follow-up, states that she continues to have significant weakness. States that she is resting most of the time. She continues to have nausea and occasional vomiting but avoids taking pills including antiemetics including Zofran ODT. States that she does not want to try suppository either. Has persistent left shoulder pain and is concerned if this is from bone metastases. She also avoids taking pain medication. Still has intermittent vision symptoms. She also c/o persistent vertigo. Denies any muscle weakness in lower extremities, fecal or urinary retention or incontinence. She continues to have intermittent dyspnea on exertion and cough but no hemoptysis or chest pain. No fever or chills.   REVIEW OF SYSTEMS:   ROS As in HPI above. In addition, no fever, chills. No new headaches or focal weakness.  No sore  throat or dysphagia. No angina or palpitation. No abdominal pain, constipation, diarrhea, dysuria or hematuria. No new skin rash or bleeding symptoms. No new paresthesias in extremities. PS ECOG 3.  PAST MEDICAL HISTORY: Reviewed. Past Medical History  Diagnosis Date  . Diabetes mellitus without complication   . Wears glasses   . Arthritis   . Family history of anesthesia complication     sister hard to put under  . Non-small cell carcinoma of left lung, stage 4 03/22/2015    PAST SURGICAL HISTORY: Reviewed. Past Surgical History  Procedure Laterality Date  . Abdominal hysterectomy      partial  . Cesarean section      x2  . Dorsal compartment release Right 06/05/2013    Procedure: RELEASE RIGHT FIRST DORSAL COMPARTMENT (DEQUERVAIN);  Surgeon: Cammie Sickle., MD;  Location: [facility];  Service: Orthopedics;  Laterality: Right;    FAMILY HISTORY: Reviewed. Sister with rheumatoid arthritis. Grandmother had breast cancer at age 55. Denies other malignancies  SOCIAL HISTORY: Reviewed. Social History  Substance Use Topics  . Smoking status: Never Smoker   . Smokeless tobacco: Not on file  . Alcohol Use: No    Allergies  Allergen Reactions  . Bactrim [Sulfamethoxazole-Trimethoprim]     Gi upset  . Bactrim [Sulfamethoxazole-Trimethoprim]     Gi upset  . Flagyl [Metronidazole] Nausea And Vomiting  . Other     omnicef-diarrhia    Current Outpatient Prescriptions  Medication Sig Dispense Refill  . fluconazole (DIFLUCAN) 150 MG tablet Take 1 tablet (150 mg total) by mouth daily. 3 tablet 0  . glipiZIDE (GLUCOTROL) 5 MG tablet Take 5 mg by mouth 2 (two) times daily before a meal.    .  HYDROcodone-acetaminophen (NORCO/VICODIN) 5-325 MG per tablet 1 - 2  Tablets every 4 hours as needed 30 tablet 0  . meclizine (ANTIVERT) 25 MG tablet Take 1 tablet (25 mg total) by mouth 3 (three) times daily as needed for dizziness. 30 tablet 0  . ondansetron (ZOFRAN ODT) 4 MG  disintegrating tablet Take 1 tablet (4 mg total) by mouth every 8 (eight) hours as needed for nausea or vomiting. 30 tablet 0   No current facility-administered medications for this visit.    PHYSICAL EXAM: Filed Vitals:   06/09/15 1134  BP: 112/78  Pulse: 112  Temp: 97.3 F (36.3 C)     Body mass index is 22.98 kg/(m^2).    ECOG FS:3 - Symptomatic, >50% confined to bed  GENERAL: Patient is weak and tired looking, otherwise alert and oriented and in no acute distress. There is no icterus. HEENT: EOMs intact. Oral exam negative for thrush or lesions.   CVS: S1S2, regular LUNGS: Bilaterally good air entry, no rhonchi. ABDOMEN: Soft, nontender, nondistended. No hepatomegaly clinically.  NEURO: grossly nonfocal, cranial nerves are intact.   EXTREMITIES: No pedal edema.  LAB RESULTS:    Component Value Date/Time   NA 132* 06/09/2015 1123   NA 134* 02/22/2015 1335   K 3.7 06/09/2015 1123   K 3.8 02/22/2015 1335   CL 96* 06/09/2015 1123   CL 100* 02/22/2015 1335   CO2 29 06/09/2015 1123   CO2 26 02/22/2015 1335   GLUCOSE 473* 06/09/2015 1123   GLUCOSE 357* 02/22/2015 1335   BUN 10 06/09/2015 1123   BUN 13 02/22/2015 1335   CREATININE 0.84 06/09/2015 1123   CREATININE 0.97 02/22/2015 1335   CALCIUM 8.6* 06/09/2015 1123   CALCIUM 9.4 02/22/2015 1335   PROT 7.0 06/09/2015 1123   PROT 7.2 02/22/2015 1335   ALBUMIN 3.7 06/09/2015 1123   ALBUMIN 4.1 02/22/2015 1335   AST 12* 06/09/2015 1123   AST 16 02/22/2015 1335   ALT 14 06/09/2015 1123   ALT 14 02/22/2015 1335   ALKPHOS 85 06/09/2015 1123   ALKPHOS 74 02/22/2015 1335   BILITOT 0.7 06/09/2015 1123   BILITOT 0.4 02/22/2015 1335   GFRNONAA >60 06/09/2015 1123   GFRNONAA >60 02/22/2015 1335   GFRNONAA >60 11/25/2014 1458   GFRAA >60 06/09/2015 1123   GFRAA >60 02/22/2015 1335   GFRAA >60 11/25/2014 1458    Lab Results  Component Value Date   WBC 2.7* 06/09/2015   NEUTROABS 1.8 06/09/2015   HGB 13.4 06/09/2015    HCT 39.9 06/09/2015   MCV 88.0 06/09/2015   PLT 155 06/09/2015    STUDIES: Dg Shoulder Right  05/26/2015   CLINICAL DATA:  Bilateral shoulder pain, worse on left side. Non-small cell carcinoma of left lung. Metastatic disease?  EXAM: RIGHT SHOULDER - 2+ VIEW  COMPARISON:  None.  FINDINGS: Three views of the right shoulder are provided. There is a poorly defined sclerotic focus within the right humeral head/neck measuring 1.8 cm greatest dimension, highly suggestive of a blastic metastatic lesion.  No fracture line or displaced fracture fragment identified within the humeral head or neck. Right humeral head is well positioned relative to the glenoid fossa. Overlying acromioclavicular joint space appears well preserved and well aligned. Visualized portions of the adjacent right upper ribs appear intact and normal in mineralization.  IMPRESSION: 1.8 cm sclerotic focus within the right humeral head/ neck region highly suspicious for blastic metastatic lesion.   Electronically Signed   By: Roxy Horseman.D.  On: 05/26/2015 10:26   Dg Shoulder Left  05/26/2015   CLINICAL DATA:  BILATERAL shoulder pain. History of non-small-cell lung cancer.  EXAM: LEFT SHOULDER - 2+ VIEW  COMPARISON:  Bone scan 05/19/2015.  FINDINGS: No glenohumeral dislocation. Mild sclerosis and slight lucency in the humeral neck on the AP view could represent early plain film signs of metastatic disease. No pathologic fracture is evident. No cortical destruction. Soft tissues unremarkable. No abnormal calcifications.  IMPRESSION: No pathologic fracture. No glenohumeral dislocation. Subtle plain film signs of sclerosis and lucency in the humeral neck correlate with the scintigraphic findings of suspected metastatic disease.   Electronically Signed   By: Staci Righter M.D.   On: 05/26/2015 10:23   Dg Femur Min 2 Views Left  05/26/2015   CLINICAL DATA:  Left hip pain, metastatic lung malignancy  EXAM: LEFT FEMUR 2 VIEWS  COMPARISON:   Nuclear bone scan dated May 19, 2015  FINDINGS: There is subtle increased density in the immediate sub trochanteric region of the left femur which corresponds to the bone scan abnormality. The femoral head and CT exhibit no acute abnormalities. The joint space is preserved the femoral shaft distally exhibits no significant abnormality. The observed portions of the knee are unremarkable.  IMPRESSION: There is a subtle sclerotic lesion in the immediate sub trochanteric region of the left femur which corresponds to the abnormal uptake on the recent nuclear bone scan. This is compatible with metastatic disease.   Electronically Signed   By: David  Martinique M.D.   On: 05/26/2015 10:20     ASSESSMENT / PLAN:   1. ALK positive stage IV non-small cell lung cancer with bone metastasis, adenocarcinoma (presented with multiple bone lesions seen on CT scan, and left lung mass). Diagnosed by left lung mass biopsy 04/16/14. ALK mutation positive. Got palliative chemo ( Taxol/Carboplatin/Avastin) and completed 4 cycles on 08/04/14. Then CT chest showed response to chemo but still had residual disease especially in left upper lung. Started palliative ALK inhibitor Xalkori in November 2015. Discontinued Xalkori 01/06/15 due to side effects. Started Zykadia (Ceritinib) around 02/17/15 at 3 tablets (450 mg) daily but decided to stop it on her own after few days only - reviewed labs from today and d/w patient and family present. Patient states that she has made a final decision not to pursue  Zykadia or any other cancer treatment. Generalized weakness and fatigue is worsening, she is mostly resting in bed. Have encouraged patient to try oral antiemetics including Zofran ODT to see if nausea comes under control, and then try to improve oral food and water intake, will give Reglan 10 mg IM today 1 dose. States that she will try to do so. Patient also is hesitant to take pain medications, have recommended that since we are not pursuing  active cancer treatment we could initiate home hospice to see if that would help monitor better and to palliate/control symptoms and pain better. Patient and family are agreeable to this, we will make hospice referral. They are aware that overall prognosis is extremely poor.   2. Bone metastasis, on bisphosphonate therapy - patient wishes to continue taking Xgeva one 20 mg subcutaneous every 4 weekly. We will check calcium prior to each dose, next M.D. follow-up in 12 weeks.    3. Pain - as described above.   4. In between visits, patient advised to call or come to the ER in case of any new symptoms or acute sickness. She is agreeable to  this plan.     Leia Alf, MD   06/21/2015 1:14 PM

## 2015-07-07 ENCOUNTER — Inpatient Hospital Stay: Payer: 59

## 2015-07-07 ENCOUNTER — Inpatient Hospital Stay: Payer: 59 | Attending: Internal Medicine

## 2015-07-07 DIAGNOSIS — C3492 Malignant neoplasm of unspecified part of left bronchus or lung: Secondary | ICD-10-CM | POA: Diagnosis present

## 2015-07-07 DIAGNOSIS — C7951 Secondary malignant neoplasm of bone: Secondary | ICD-10-CM | POA: Diagnosis not present

## 2015-07-07 DIAGNOSIS — D649 Anemia, unspecified: Secondary | ICD-10-CM | POA: Insufficient documentation

## 2015-07-07 DIAGNOSIS — Z79899 Other long term (current) drug therapy: Secondary | ICD-10-CM | POA: Insufficient documentation

## 2015-07-07 LAB — CBC WITH DIFFERENTIAL/PLATELET
BASOS PCT: 0 %
Basophils Absolute: 0 10*3/uL (ref 0–0.1)
EOS ABS: 0.1 10*3/uL (ref 0–0.7)
Eosinophils Relative: 1 %
HCT: 40 % (ref 35.0–47.0)
HEMOGLOBIN: 13.6 g/dL (ref 12.0–16.0)
Lymphocytes Relative: 29 %
Lymphs Abs: 1.1 10*3/uL (ref 1.0–3.6)
MCH: 29.5 pg (ref 26.0–34.0)
MCHC: 33.9 g/dL (ref 32.0–36.0)
MCV: 86.9 fL (ref 80.0–100.0)
MONOS PCT: 9 %
Monocytes Absolute: 0.3 10*3/uL (ref 0.2–0.9)
NEUTROS PCT: 61 %
Neutro Abs: 2.3 10*3/uL (ref 1.4–6.5)
PLATELETS: 168 10*3/uL (ref 150–440)
RBC: 4.6 MIL/uL (ref 3.80–5.20)
RDW: 13.9 % (ref 11.5–14.5)
WBC: 3.7 10*3/uL (ref 3.6–11.0)

## 2015-07-07 LAB — HEPATIC FUNCTION PANEL
ALK PHOS: 79 U/L (ref 38–126)
ALT: 9 U/L — AB (ref 14–54)
AST: 12 U/L — ABNORMAL LOW (ref 15–41)
Albumin: 3.9 g/dL (ref 3.5–5.0)
BILIRUBIN TOTAL: 0.7 mg/dL (ref 0.3–1.2)
Total Protein: 7.1 g/dL (ref 6.5–8.1)

## 2015-07-07 LAB — CREATININE, SERUM
CREATININE: 0.66 mg/dL (ref 0.44–1.00)
GFR calc Af Amer: >60 mL/min (ref >60)
GFR calc non Af Amer: >60 mL/min (ref >60)

## 2015-07-07 LAB — CALCIUM: Calcium: 8.3 mg/dL — ABNORMAL LOW (ref 8.9–10.3)

## 2015-07-07 MED ORDER — HEPARIN SOD (PORK) LOCK FLUSH 100 UNIT/ML IV SOLN
INTRAVENOUS | Status: AC
Start: 2015-07-07 — End: 2015-07-07
  Filled 2015-07-07: qty 5

## 2015-07-07 MED ORDER — HEPARIN SOD (PORK) LOCK FLUSH 100 UNIT/ML IV SOLN
500.0000 [IU] | Freq: Once | INTRAVENOUS | Status: AC
  Administered 2015-07-07: 500 [IU] via INTRAVENOUS

## 2015-07-07 MED ORDER — DENOSUMAB 120 MG/1.7ML ~~LOC~~ SOLN
120.0000 mg | Freq: Once | SUBCUTANEOUS | Status: AC
  Administered 2015-07-07: 120 mg via SUBCUTANEOUS
  Filled 2015-07-07: qty 1.7

## 2015-07-07 MED ORDER — SODIUM CHLORIDE 0.9 % IJ SOLN
10.0000 mL | INTRAMUSCULAR | Status: AC | PRN
  Administered 2015-07-07: 10 mL via INTRAVENOUS
  Filled 2015-07-07: qty 10

## 2015-07-14 ENCOUNTER — Ambulatory Visit: Payer: 59 | Admitting: Radiation Oncology

## 2015-07-19 ENCOUNTER — Other Ambulatory Visit: Payer: Self-pay | Admitting: *Deleted

## 2015-07-19 NOTE — Telephone Encounter (Signed)
I think she is in hospice, please see if they can get her a supply. Thanks.

## 2015-07-19 NOTE — Telephone Encounter (Signed)
Per Dr Ma Hillock since patient refused hospice have her come in to see [facility] tomorrow. I called pt adn she said she is leaving for a trip tomorrow

## 2015-07-19 NOTE — Telephone Encounter (Signed)
Asking for patch for nausea that goes behind her ear (? Scopolamine) Also requesting to be evaluated for IVF

## 2015-07-20 NOTE — Telephone Encounter (Signed)
Called and left pt a message that she could try dramamine over the counter. It does not have the side effects that the scopolamine patch has.

## 2015-08-04 ENCOUNTER — Inpatient Hospital Stay: Payer: 59

## 2015-08-04 ENCOUNTER — Inpatient Hospital Stay: Payer: 59 | Attending: Internal Medicine

## 2015-08-04 DIAGNOSIS — R112 Nausea with vomiting, unspecified: Secondary | ICD-10-CM | POA: Diagnosis not present

## 2015-08-04 DIAGNOSIS — R630 Anorexia: Secondary | ICD-10-CM | POA: Diagnosis not present

## 2015-08-04 DIAGNOSIS — C7931 Secondary malignant neoplasm of brain: Secondary | ICD-10-CM | POA: Diagnosis not present

## 2015-08-04 DIAGNOSIS — Z79899 Other long term (current) drug therapy: Secondary | ICD-10-CM | POA: Diagnosis not present

## 2015-08-04 DIAGNOSIS — R531 Weakness: Secondary | ICD-10-CM | POA: Diagnosis not present

## 2015-08-04 DIAGNOSIS — C7951 Secondary malignant neoplasm of bone: Secondary | ICD-10-CM | POA: Diagnosis not present

## 2015-08-04 DIAGNOSIS — C3492 Malignant neoplasm of unspecified part of left bronchus or lung: Secondary | ICD-10-CM | POA: Insufficient documentation

## 2015-08-04 DIAGNOSIS — E119 Type 2 diabetes mellitus without complications: Secondary | ICD-10-CM | POA: Diagnosis not present

## 2015-08-04 LAB — CBC WITH DIFFERENTIAL/PLATELET
BASOS ABS: 0 10*3/uL (ref 0–0.1)
BASOS PCT: 1 %
EOS ABS: 0.1 10*3/uL (ref 0–0.7)
EOS PCT: 1 %
HCT: 40.9 % (ref 35.0–47.0)
Hemoglobin: 13.8 g/dL (ref 12.0–16.0)
LYMPHS PCT: 21 %
Lymphs Abs: 1.1 10*3/uL (ref 1.0–3.6)
MCH: 29.3 pg (ref 26.0–34.0)
MCHC: 33.8 g/dL (ref 32.0–36.0)
MCV: 86.9 fL (ref 80.0–100.0)
MONO ABS: 0.4 10*3/uL (ref 0.2–0.9)
Monocytes Relative: 8 %
Neutro Abs: 3.7 10*3/uL (ref 1.4–6.5)
Neutrophils Relative %: 69 %
PLATELETS: 218 10*3/uL (ref 150–440)
RBC: 4.71 MIL/uL (ref 3.80–5.20)
RDW: 14.2 % (ref 11.5–14.5)
WBC: 5.2 10*3/uL (ref 3.6–11.0)

## 2015-08-04 LAB — CREATININE, SERUM: CREATININE: 0.88 mg/dL (ref 0.44–1.00)

## 2015-08-04 LAB — CALCIUM: CALCIUM: 7.9 mg/dL — AB (ref 8.9–10.3)

## 2015-08-04 NOTE — Progress Notes (Signed)
   08/04/15 1445  Clinical Encounter Type  Visited With Patient and family together  Visit Type Initial  Spiritual Encounters  Spiritual Needs Literature  Advance Directives (For Healthcare)  Does patient have an advance directive? No  Would patient like information on creating an advanced directive? Yes - Retail buyer presence, support and HCPOA/Living Will forms and education given to patient and family member in the cancer center.  [place] [phone]

## 2015-08-06 ENCOUNTER — Ambulatory Visit
Admission: RE | Admit: 2015-08-06 | Discharge: 2015-08-06 | Disposition: A | Discharge status: Home / Self Care | Payer: 59 | Source: Ambulatory Visit | Attending: Radiation Oncology | Admitting: Radiation Oncology

## 2015-08-06 ENCOUNTER — Inpatient Hospital Stay (HOSPITAL_BASED_OUTPATIENT_CLINIC_OR_DEPARTMENT_OTHER): Payer: 59 | Admitting: Family Medicine

## 2015-08-06 ENCOUNTER — Inpatient Hospital Stay: Payer: 59

## 2015-08-06 ENCOUNTER — Encounter: Payer: Self-pay | Admitting: Radiation Oncology

## 2015-08-06 VITALS — BP 119/86 | HR 121 | Temp 96.8°F | Resp 21 | Wt 109.3 lb

## 2015-08-06 VITALS — BP 103/70 | HR 101 | Temp 99.0°F | Resp 18

## 2015-08-06 DIAGNOSIS — R112 Nausea with vomiting, unspecified: Secondary | ICD-10-CM

## 2015-08-06 DIAGNOSIS — R531 Weakness: Secondary | ICD-10-CM

## 2015-08-06 DIAGNOSIS — C7951 Secondary malignant neoplasm of bone: Secondary | ICD-10-CM | POA: Diagnosis not present

## 2015-08-06 DIAGNOSIS — Z79899 Other long term (current) drug therapy: Secondary | ICD-10-CM

## 2015-08-06 DIAGNOSIS — C7931 Secondary malignant neoplasm of brain: Secondary | ICD-10-CM | POA: Diagnosis not present

## 2015-08-06 DIAGNOSIS — C3492 Malignant neoplasm of unspecified part of left bronchus or lung: Secondary | ICD-10-CM

## 2015-08-06 DIAGNOSIS — R630 Anorexia: Secondary | ICD-10-CM

## 2015-08-06 MED ORDER — HEPARIN SOD (PORK) LOCK FLUSH 100 UNIT/ML IV SOLN
500.0000 [IU] | Freq: Once | INTRAVENOUS | Status: AC
  Administered 2015-08-06: 500 [IU] via INTRAVENOUS

## 2015-08-06 MED ORDER — SODIUM CHLORIDE 0.9 % IV SOLN
INTRAVENOUS | Status: DC
  Administered 2015-08-06: 13:00:00 via INTRAVENOUS
  Filled 2015-08-06: qty 1000

## 2015-08-06 MED ORDER — SODIUM CHLORIDE 0.9 % IV SOLN
Freq: Once | INTRAVENOUS | Status: AC
  Administered 2015-08-06: 13:00:00 via INTRAVENOUS
  Filled 2015-08-06: qty 4

## 2015-08-06 MED ORDER — KETOROLAC TROMETHAMINE 30 MG/ML IJ SOLN
30.0000 mg | Freq: Once | INTRAMUSCULAR | Status: AC
Start: 2015-08-06 — End: 2015-08-06
  Administered 2015-08-06: 30 mg via INTRAVENOUS
  Filled 2015-08-06: qty 1

## 2015-08-06 NOTE — Progress Notes (Signed)
Pt feeling very weak, unable to tolerate any PO's.  She is hoping for IV fluids.

## 2015-08-06 NOTE — Progress Notes (Signed)
Radiation Oncology Follow up Note  Name: Terri Marks   Date:   08/06/2015 MRN:  [id number] DOB: 04/30/1956    This 59 y.o. female presents to the clinic today for follow-up for palliative ration therapy to her brain.  REFERRING PROVIDER: No ref. provider found  HPI: Patient is a 59 year old female now 1 month out having completed palliative radiation therapy to her brain for stage IV adenocarcinoma the left lung with left oral metastasis. 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 She is seen today in routine follow-up and seems to be declining. Her major complaint is chest pain. She's currently on X [place]. She is currently on a fentanyl patch and she has been referred to hospice.  COMPLICATIONS OF TREATMENT: none  FOLLOW UP COMPLIANCE: keeps appointments   PHYSICAL EXAM:  BP 119/86 mmHg  Pulse 121  Temp(Src) 96.8 F (36 C)  Resp 21  Wt 109 lb 5.6 oz (49.6 kg) Well-developed wheelchair-bound female appears frail and declining. No evidence of oral candidiasis. Cranial nerves II through XII are grossly intact. Motor sensory and DTR levels appear equal and symmetric. Crude visual fields are within normal range. Well-developed well-nourished patient in NAD. HEENT reveals PERLA, EOMI, discs not visualized.  Oral cavity is clear. No oral mucosal lesions are identified. Neck is clear without evidence of cervical or supraclavicular adenopathy. Lungs are clear to A&P. Cardiac examination is essentially unremarkable with regular rate and rhythm without murmur rub or thrill. Abdomen is benign with no organomegaly or masses noted. Motor sensory and DTR levels are equal and symmetric in the upper and lower extremities. Cranial nerves II through XII are grossly intact. Proprioception is intact. No peripheral adenopathy or edema is identified. No motor or sensory levels are noted. Crude visual fields are within  normal range.  RADIOLOGY RESULTS: Previous bone scan is reviewed  PLAN: At the present time I see no need based on her bone scan for anymore palliative radiation therapy. I'll turn follow-up care over to medical oncology and the hospice palliative care people. Would be happy to reevaluate her at any time should further palliative treatment be indicated.  I would like to take this opportunity for allowing me to participate in the care of your patient.Armstead Peaks., MD

## 2015-08-06 NOTE — Progress Notes (Signed)
[place]  Telephone:([phone]) [phone]  Fax:(336) [phone]     Terri Marks DOB: 1956/06/15  MR#: [id number]  AYT#:[id number]  Patient Care Team: No Pcp Per Patient as PCP - General (General Practice)  CHIEF COMPLAINT:  Chief Complaint  Patient presents with  . Follow-up    Pt very weak, wanting fluids due to poor appetite.  Unable to tolerate any PO intake.      INTERVAL HISTORY:  Patient is here for continued follow-up regarding stage IV adenocarcinoma of lung. 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 Patient appears very weak. She was seen for Wednesday by primary oncologist. At that time recommendation was for hospice care. She reports having continued and persistent nausea and occasional vomiting and therefore is unwilling to try any oral food. She reports having continued visual disturbances. Also reports having shortness of breath, weakness, extreme fatigue, increasing back and chest wall pain.  REVIEW OF SYSTEMS:   Review of Systems  Constitutional: Positive for weight loss and malaise/fatigue. Negative for fever, chills and diaphoresis.       Anorexia  HENT: Negative for congestion, ear discharge, ear pain, hearing loss, nosebleeds, sore throat and tinnitus.   Eyes: Positive for blurred vision. Negative for double vision, photophobia, pain, discharge and redness.  Respiratory: Positive for shortness of breath. Negative for cough, hemoptysis, sputum production and stridor.   Cardiovascular: Positive for chest pain. Negative for palpitations, orthopnea, claudication, leg swelling and PND.  Gastrointestinal: Negative for heartburn, nausea, vomiting, abdominal pain, diarrhea, constipation, blood in stool and melena.  Genitourinary: Negative.   Musculoskeletal: Positive for back pain.  Skin: Negative.   Neurological: Positive for weakness. Negative for dizziness, tingling, focal  weakness, seizures and headaches.  Endo/Heme/Allergies: Does not bruise/bleed easily.  Psychiatric/Behavioral: Negative for depression. The patient is nervous/anxious. The patient does not have insomnia.     As per HPI. Otherwise, a complete review of systems is negatve.  PAST MEDICAL HISTORY: Past Medical History  Diagnosis Date  . Diabetes mellitus without complication ([place])   . Wears glasses   . Arthritis   . Family history of anesthesia complication     sister hard to put under  . Non-small cell carcinoma of left lung, stage 4 ([place]) 03/22/2015    PAST SURGICAL HISTORY: Past Surgical History  Procedure Laterality Date  . Abdominal hysterectomy      partial  . Cesarean section      x2  . Dorsal compartment release Right 06/05/2013    Procedure: RELEASE RIGHT FIRST DORSAL COMPARTMENT (DEQUERVAIN);  Surgeon: Cammie Sickle., MD;  Location: [facility];  Service: Orthopedics;  Laterality: Right;    FAMILY HISTORY No family history on file.  GYNECOLOGIC HISTORY:  No LMP recorded. Patient has had a hysterectomy.     ADVANCED DIRECTIVES:    HEALTH MAINTENANCE: Social History  Substance Use Topics  . Smoking status: Never Smoker   . Smokeless tobacco: Not on file  . Alcohol Use: No     Colonoscopy:  PAP:  Bone density:  Lipid panel:  Allergies  Allergen Reactions  . Bactrim [Sulfamethoxazole-Trimethoprim]     Gi upset  . Bactrim [Sulfamethoxazole-Trimethoprim]     Gi upset  . Flagyl [Metronidazole] Nausea And Vomiting  . Other     omnicef-diarrhia    Current Outpatient Prescriptions  Medication Sig Dispense Refill  . fluconazole (DIFLUCAN) 150 MG tablet Take 1 tablet (150 mg  total) by mouth daily. 3 tablet 0  . glipiZIDE (GLUCOTROL) 5 MG tablet Take 5 mg by mouth 2 (two) times daily before a meal.    . HYDROcodone-acetaminophen (NORCO/VICODIN) 5-325 MG per tablet 1 - 2  Tablets every 4 hours as needed 30 tablet 0  . meclizine (ANTIVERT) 25  MG tablet Take 1 tablet (25 mg total) by mouth 3 (three) times daily as needed for dizziness. 30 tablet 0  . ondansetron (ZOFRAN ODT) 4 MG disintegrating tablet Take 1 tablet (4 mg total) by mouth every 8 (eight) hours as needed for nausea or vomiting. 30 tablet 0   Current Facility-Administered Medications  Medication Dose Route Frequency Provider Last Rate Last Dose  . 0.9 %  sodium chloride infusion   Intravenous Continuous Evlyn Kanner, NP 500 mL/hr at 08/06/15 1242     Facility-Administered Medications Ordered in Other Visits  Medication Dose Route Frequency Provider Last Rate Last Dose  . sodium chloride 0.9 % injection 10 mL  10 mL Intravenous PRN Leia Alf, MD   10 mL at 07/07/15 1459    OBJECTIVE: There were no vitals taken for this visit.   There is no weight on file to calculate BMI.    ECOG FS:3 - Symptomatic, >50% confined to bed  General: Cachectic, sitting in wheelchair. Poor performance status. Eyes: Pink conjunctiva, anicteric sclera. HEENT: Normocephalic, moist mucous membranes, clear oropharnyx. Lungs: Diminished bilaterally. Heart: Regular rate and rhythm. No rubs, murmurs, or gallops. Musculoskeletal: No edema, cyanosis, or clubbing. Back and chest wall pain Neuro: Alert, answering all questions appropriately. Cranial nerves grossly intact. Skin: No rashes or petechiae noted. Psych: Normal affect, patient is anxious regarding hospice care.    LAB RESULTS:  Appointment on 08/04/2015  Component Date Value Ref Range Status  . WBC 08/04/2015 5.2  3.6 - 11.0 K/uL Final  . RBC 08/04/2015 4.71  3.80 - 5.20 MIL/uL Final  . Hemoglobin 08/04/2015 13.8  12.0 - 16.0 g/dL Final  . HCT 08/04/2015 40.9  35.0 - 47.0 % Final  . MCV 08/04/2015 86.9  80.0 - 100.0 fL Final  . MCH 08/04/2015 29.3  26.0 - 34.0 pg Final  . MCHC 08/04/2015 33.8  32.0 - 36.0 g/dL Final  . RDW 08/04/2015 14.2  11.5 - 14.5 % Final  . Platelets 08/04/2015 218  150 - 440 K/uL Final  .  Neutrophils Relative % 08/04/2015 69   Final  . Neutro Abs 08/04/2015 3.7  1.4 - 6.5 K/uL Final  . Lymphocytes Relative 08/04/2015 21   Final  . Lymphs Abs 08/04/2015 1.1  1.0 - 3.6 K/uL Final  . Monocytes Relative 08/04/2015 8   Final  . Monocytes Absolute 08/04/2015 0.4  0.2 - 0.9 K/uL Final  . Eosinophils Relative 08/04/2015 1   Final  . Eosinophils Absolute 08/04/2015 0.1  0 - 0.7 K/uL Final  . Basophils Relative 08/04/2015 1   Final  . Basophils Absolute 08/04/2015 0.0  0 - 0.1 K/uL Final  . Creatinine, Ser 08/04/2015 0.88  0.44 - 1.00 mg/dL Final  . GFR calc non Af Amer 08/04/2015 >60  >60 mL/min Final  . GFR calc Af Amer 08/04/2015 >60  >60 mL/min Final   Comment: (NOTE) The eGFR has been calculated using the CKD EPI equation. This calculation has not been validated in all clinical situations. eGFR's persistently <60 mL/min signify possible Chronic Kidney Disease.   . Calcium 08/04/2015 7.9* 8.9 - 10.3 mg/dL Final    STUDIES: No results found.  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  PLAN:   1. Lung CA. Patient is agreeable to hospice care at this time as all other treatments have been stopped. She reports having an appointment with her pulmonologist next week that she would like to go to before calling the hospice to begin care. She was previously receiving Xgeva for bone metastasis but most recently calcium was low and therefore not given. Will hydrate patient with IV fluids today as well as Zofran for nausea and IV Toradol for pain. 2. Back and chest wall pain. Advised patient to increase her fentanyl patch from 12 g to 25 g.  We'll make follow-up appointment with me on Wednesday of next week to evaluate pain management. Advised patient that if she needed to be seen any sooner if she could call our office.  Patient expressed understanding and was in agreement with this plan. She also understands that She can call clinic at any  time with any questions, concerns, or complaints.   Dr. Oliva Bustard was available for consultation and review of plan of care for this patient.   Evlyn Kanner, NP   08/06/2015 2:50 PM

## 2015-08-11 ENCOUNTER — Inpatient Hospital Stay: Payer: 59

## 2015-08-11 ENCOUNTER — Telehealth: Payer: Self-pay | Admitting: *Deleted

## 2015-08-11 ENCOUNTER — Inpatient Hospital Stay (HOSPITAL_BASED_OUTPATIENT_CLINIC_OR_DEPARTMENT_OTHER): Payer: 59 | Admitting: Family Medicine

## 2015-08-11 VITALS — BP 101/71 | HR 112 | Temp 97.6°F | Wt 111.1 lb

## 2015-08-11 DIAGNOSIS — Z79899 Other long term (current) drug therapy: Secondary | ICD-10-CM

## 2015-08-11 DIAGNOSIS — C7931 Secondary malignant neoplasm of brain: Secondary | ICD-10-CM | POA: Diagnosis not present

## 2015-08-11 DIAGNOSIS — R630 Anorexia: Secondary | ICD-10-CM

## 2015-08-11 DIAGNOSIS — C7951 Secondary malignant neoplasm of bone: Secondary | ICD-10-CM

## 2015-08-11 DIAGNOSIS — C3492 Malignant neoplasm of unspecified part of left bronchus or lung: Secondary | ICD-10-CM

## 2015-08-11 LAB — COMPREHENSIVE METABOLIC PANEL
ALBUMIN: 3.6 g/dL (ref 3.5–5.0)
ALK PHOS: 84 U/L (ref 38–126)
ALT: 6 U/L — AB (ref 14–54)
AST: 13 U/L — AB (ref 15–41)
Anion gap: 12 (ref 5–15)
BILIRUBIN TOTAL: 0.8 mg/dL (ref 0.3–1.2)
BUN: 6 mg/dL (ref 6–20)
CO2: 21 mmol/L — AB (ref 22–32)
Calcium: 8 mg/dL — ABNORMAL LOW (ref 8.9–10.3)
Chloride: 102 mmol/L (ref 101–111)
Creatinine, Ser: 0.57 mg/dL (ref 0.44–1.00)
GFR calc Af Amer: >60 mL/min (ref >60)
Glucose, Bld: 144 mg/dL — ABNORMAL HIGH (ref 65–99)
Potassium: 3.1 mmol/L — ABNORMAL LOW (ref 3.5–5.1)
Sodium: 135 mmol/L (ref 135–145)
Total Protein: 6.8 g/dL (ref 6.5–8.1)

## 2015-08-11 LAB — CBC WITH DIFFERENTIAL/PLATELET
BASOS ABS: 0 10*3/uL (ref 0–0.1)
BASOS PCT: 0 %
Eosinophils Absolute: 0.1 10*3/uL (ref 0–0.7)
Eosinophils Relative: 2 %
HEMATOCRIT: 36.4 % (ref 35.0–47.0)
HEMOGLOBIN: 12.3 g/dL (ref 12.0–16.0)
LYMPHS PCT: 24 %
Lymphs Abs: 1.1 10*3/uL (ref 1.0–3.6)
MCH: 29.3 pg (ref 26.0–34.0)
MCHC: 33.7 g/dL (ref 32.0–36.0)
MCV: 86.8 fL (ref 80.0–100.0)
Monocytes Absolute: 0.4 10*3/uL (ref 0.2–0.9)
Monocytes Relative: 10 %
NEUTROS ABS: 3 10*3/uL (ref 1.4–6.5)
NEUTROS PCT: 64 %
Platelets: 150 10*3/uL (ref 150–440)
RBC: 4.2 MIL/uL (ref 3.80–5.20)
RDW: 14 % (ref 11.5–14.5)
WBC: 4.7 10*3/uL (ref 3.6–11.0)

## 2015-08-11 MED ORDER — KETOROLAC TROMETHAMINE 30 MG/ML IJ SOLN
30.0000 mg | Freq: Once | INTRAMUSCULAR | Status: AC
  Administered 2015-08-11: 30 mg via INTRAVENOUS
  Filled 2015-08-11: qty 1

## 2015-08-11 MED ORDER — MEGESTROL ACETATE 40 MG/ML PO SUSP: 400.0000 mg | Freq: Two times a day (BID) | ORAL | Status: DC

## 2015-08-11 MED ORDER — DEXAMETHASONE SODIUM PHOSPHATE 10 MG/ML IJ SOLN
10.0000 mg | Freq: Once | INTRAMUSCULAR | Status: AC
  Administered 2015-08-11: 10 mg via INTRAVENOUS
  Filled 2015-08-11: qty 1

## 2015-08-11 MED ORDER — SODIUM CHLORIDE 0.9 % IJ SOLN
10.0000 mL | Freq: Once | INTRAMUSCULAR | Status: AC
  Administered 2015-08-11: 10 mL via INTRAVENOUS
  Filled 2015-08-11: qty 10

## 2015-08-11 MED ORDER — HEPARIN SOD (PORK) LOCK FLUSH 100 UNIT/ML IV SOLN
INTRAVENOUS | Status: AC
  Filled 2015-08-11: qty 5

## 2015-08-11 MED ORDER — HEPARIN SOD (PORK) LOCK FLUSH 100 UNIT/ML IV SOLN
500.0000 [IU] | Freq: Once | INTRAVENOUS | Status: AC
  Administered 2015-08-11: 500 [IU] via INTRAVENOUS

## 2015-08-11 MED ORDER — SODIUM CHLORIDE 0.9 % IV SOLN
Freq: Once | INTRAVENOUS | Status: AC
  Administered 2015-08-11: 14:00:00 via INTRAVENOUS
  Filled 2015-08-11: qty 1000

## 2015-08-11 MED ORDER — FENTANYL 25 MCG/HR TD PT72
25.0000 ug | MEDICATED_PATCH | TRANSDERMAL | Status: DC
Start: 2015-08-11 — End: 2015-10-06

## 2015-08-11 NOTE — Telephone Encounter (Signed)
Called triage and spoke to [place].  We had made referral over a month ago and when hospice came out she was to independent at that time and now she is ready. Pain control, poor nutritional intake.  Jackelyn Poling has her in the system and will send a message to intake and I told Jackelyn Poling that pt wants hospice to come in on Monday.

## 2015-08-11 NOTE — Telephone Encounter (Signed)
Needs new order for services

## 2015-08-11 NOTE — Progress Notes (Signed)
[place]  [phone]) [phone]  [phone]     Terri Marks DOB: 22-Jun-1956  MR#: [id number]  YVO#:[id number]  Patient Care Team: No Pcp Per Patient as PCP - General (General Practice)  CHIEF COMPLAINT:  Chief Complaint  Patient presents with  . Follow-up    INTERVAL HISTORY:  Patient is here for continued follow-up regarding stage IV adenocarcinoma of lung. 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 Patient appears very weak. Patient was evaluated by myself late last week and at that time recommendation was for hospice care. She reports having continued and persistent nausea and occasional vomiting and therefore is unwilling to try any oral food. She reports having continued visual disturbances. Also reports having shortness of breath, weakness, extreme fatigue, increasing back and chest wall pain.  REVIEW OF SYSTEMS:   Review of Systems  Constitutional: Positive for weight loss and malaise/fatigue. Negative for fever, chills and diaphoresis.       Anorexia  HENT: Negative for congestion, ear discharge, ear pain, hearing loss, nosebleeds, sore throat and tinnitus.   Eyes: Positive for blurred vision. Negative for double vision, photophobia, pain, discharge and redness.  Respiratory: Positive for shortness of breath. Negative for cough, hemoptysis, sputum production and stridor.   Cardiovascular: Negative for palpitations, orthopnea, claudication, leg swelling and PND.       Chest wall pain  Gastrointestinal: Negative for heartburn, nausea, vomiting, abdominal pain, diarrhea, constipation, blood in stool and melena.  Genitourinary: Negative.   Musculoskeletal: Positive for back pain.  Skin: Negative.   Neurological: Positive for weakness. Negative for dizziness, tingling, focal weakness, seizures and headaches.  Endo/Heme/Allergies: Does not bruise/bleed easily.    Psychiatric/Behavioral: Negative for depression. The patient is nervous/anxious. The patient does not have insomnia.     As per HPI. Otherwise, a complete review of systems is negatve.  PAST MEDICAL HISTORY: Past Medical History  Diagnosis Date  . Diabetes mellitus without complication ([place])   . Wears glasses   . Arthritis   . Family history of anesthesia complication     sister hard to put under  . Non-small cell carcinoma of left lung, stage 4 ([place]) 03/22/2015    PAST SURGICAL HISTORY: Past Surgical History  Procedure Laterality Date  . Abdominal hysterectomy      partial  . Cesarean section      x2  . Dorsal compartment release Right 06/05/2013    Procedure: RELEASE RIGHT FIRST DORSAL COMPARTMENT (DEQUERVAIN);  Surgeon: Cammie Sickle., MD;  Location: [facility];  Service: Orthopedics;  Laterality: Right;    FAMILY HISTORY No family history on file.  GYNECOLOGIC HISTORY:  No LMP recorded. Patient has had a hysterectomy.     ADVANCED DIRECTIVES:    HEALTH MAINTENANCE: Social History  Substance Use Topics  . Smoking status: Never Smoker   . Smokeless tobacco: Not on file  . Alcohol Use: No     Colonoscopy:  PAP:  Bone density:  Lipid panel:  Allergies  Allergen Reactions  . Bactrim [Sulfamethoxazole-Trimethoprim]     Gi upset  . Bactrim [Sulfamethoxazole-Trimethoprim]     Gi upset  . Flagyl [Metronidazole] Nausea And Vomiting  . Omnicef [Cefdinir] Diarrhea    States she will not take even if prescribed. Stomach cramps and diarrhea  . Other     omnicef-diarrhia    Current Outpatient Prescriptions  Medication Sig Dispense Refill  . fentaNYL (DURAGESIC - DOSED  MCG/HR) 25 MCG/HR patch Place 1 patch (25 mcg total) onto the skin every 3 (three) days. 10 patch 0  . fluconazole (DIFLUCAN) 150 MG tablet Take 1 tablet (150 mg total) by mouth daily. (Patient not taking: Reported on 08/11/2015) 3 tablet 0  . glipiZIDE (GLUCOTROL) 5 MG tablet  Take 5 mg by mouth 2 (two) times daily before a meal.    . HYDROcodone-acetaminophen (NORCO/VICODIN) 5-325 MG per tablet 1 - 2  Tablets every 4 hours as needed (Patient not taking: Reported on 08/11/2015) 30 tablet 0  . meclizine (ANTIVERT) 25 MG tablet Take 1 tablet (25 mg total) by mouth 3 (three) times daily as needed for dizziness. (Patient not taking: Reported on 08/11/2015) 30 tablet 0  . ondansetron (ZOFRAN ODT) 4 MG disintegrating tablet Take 1 tablet (4 mg total) by mouth every 8 (eight) hours as needed for nausea or vomiting. (Patient not taking: Reported on 08/11/2015) 30 tablet 0   Current Facility-Administered Medications  Medication Dose Route Frequency Provider Last Rate Last Dose  . 0.9 %  sodium chloride infusion   Intravenous Once Evlyn Kanner, NP      . dexamethasone (DECADRON) injection 10 mg  10 mg Intravenous Once Evlyn Kanner, NP      . ketorolac (TORADOL) 30 MG/ML injection 30 mg  30 mg Intravenous Once Evlyn Kanner, NP       Facility-Administered Medications Ordered in Other Visits  Medication Dose Route Frequency Provider Last Rate Last Dose  . sodium chloride 0.9 % injection 10 mL  10 mL Intravenous PRN Leia Alf, MD   10 mL at 07/07/15 1459    OBJECTIVE: BP 101/71 mmHg  Pulse 112  Temp(Src) 97.6 F (36.4 C) (Tympanic)  Wt 111 lb 1.8 oz (50.4 kg)   Body mass index is 20.32 kg/(m^2).    ECOG FS:3 - Symptomatic, >50% confined to bed  General: Cachectic, sitting in wheelchair. Poor performance status. Eyes: Pink conjunctiva, anicteric sclera. HEENT: Normocephalic, moist mucous membranes, clear oropharnyx. Lungs: Diminished bilaterally. Heart: Regular rate and rhythm. No rubs, murmurs, or gallops. Musculoskeletal: No edema, cyanosis, or clubbing. Back and chest wall pain Neuro: Alert, answering all questions appropriately. Cranial nerves grossly intact. Skin: No rashes or petechiae noted. Psych: Normal affect, patient is anxious regarding hospice  care.    LAB RESULTS:  Infusion on 08/11/2015  Component Date Value Ref Range Status  . WBC 08/11/2015 4.7  3.6 - 11.0 K/uL Final   A-LINE DRAW  . RBC 08/11/2015 4.20  3.80 - 5.20 MIL/uL Final  . Hemoglobin 08/11/2015 12.3  12.0 - 16.0 g/dL Final  . HCT 08/11/2015 36.4  35.0 - 47.0 % Final  . MCV 08/11/2015 86.8  80.0 - 100.0 fL Final  . MCH 08/11/2015 29.3  26.0 - 34.0 pg Final  . MCHC 08/11/2015 33.7  32.0 - 36.0 g/dL Final  . RDW 08/11/2015 14.0  11.5 - 14.5 % Final  . Platelets 08/11/2015 150  150 - 440 K/uL Final  . Neutrophils Relative % 08/11/2015 64   Final  . Neutro Abs 08/11/2015 3.0  1.4 - 6.5 K/uL Final  . Lymphocytes Relative 08/11/2015 24   Final  . Lymphs Abs 08/11/2015 1.1  1.0 - 3.6 K/uL Final  . Monocytes Relative 08/11/2015 10   Final  . Monocytes Absolute 08/11/2015 0.4  0.2 - 0.9 K/uL Final  . Eosinophils Relative 08/11/2015 2   Final  . Eosinophils Absolute 08/11/2015 0.1  0 - 0.7 K/uL Final  .  Basophils Relative 08/11/2015 0   Final  . Basophils Absolute 08/11/2015 0.0  0 - 0.1 K/uL Final  . Sodium 08/11/2015 135  135 - 145 mmol/L Final  . Potassium 08/11/2015 3.1* 3.5 - 5.1 mmol/L Final  . Chloride 08/11/2015 102  101 - 111 mmol/L Final  . CO2 08/11/2015 21* 22 - 32 mmol/L Final  . Glucose, Bld 08/11/2015 144* 65 - 99 mg/dL Final  . BUN 08/11/2015 6  6 - 20 mg/dL Final  . Creatinine, Ser 08/11/2015 0.57  0.44 - 1.00 mg/dL Final  . Calcium 08/11/2015 8.0* 8.9 - 10.3 mg/dL Final  . Total Protein 08/11/2015 6.8  6.5 - 8.1 g/dL Final  . Albumin 08/11/2015 3.6  3.5 - 5.0 g/dL Final  . AST 08/11/2015 13* 15 - 41 U/L Final  . ALT 08/11/2015 6* 14 - 54 U/L Final  . Alkaline Phosphatase 08/11/2015 84  38 - 126 U/L Final  . Total Bilirubin 08/11/2015 0.8  0.3 - 1.2 mg/dL Final  . GFR calc non Af Amer 08/11/2015 >60  >60 mL/min Final  . GFR calc Af Amer 08/11/2015 >60  >60 mL/min Final   Comment: (NOTE) The eGFR has been calculated using the CKD EPI  equation. This calculation has not been validated in all clinical situations. eGFR's persistently <60 mL/min signify possible Chronic Kidney Disease.   . Anion gap 08/11/2015 12  5 - 15 Final    STUDIES: No results found.  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  PLAN:   1. Lung CA. Patient is agreeable to hospice care at this time as all other treatments have been stopped. She was previously receiving Xgeva for bone metastasis but most recently calcium was low and therefore not given. Will hydrate patient with IV fluids today with dexamethasone as well as Zofran for nausea and IV Toradol for pain. 2. Back and chest wall pain. Advised patient to increase her fentanyl patch from 12 g to 25 g. We'll give new prescription. 3. Anorexia. Advised patient that we can try Megace. Discussed with her that she may dislike the taste but she is agreeable to try.  Referral for hospice services has been faxed to [facility]. We'll reevaluate patient in 2 weeks to see if there is any improvement in appetite and to discuss pain management.  Patient expressed understanding and was in agreement with this plan. She also understands that She can call clinic at any time with any questions, concerns, or complaints.   Dr. Oliva Bustard was available for consultation and review of plan of care for this patient.   Evlyn Kanner, NP   08/11/2015 2:04 PM

## 2015-08-11 NOTE — Telephone Encounter (Signed)
Got a new ref. For hospice services and sent paper and notes from today to hospice

## 2015-08-12 ENCOUNTER — Other Ambulatory Visit: Payer: Self-pay | Admitting: Family Medicine

## 2015-08-12 MED ORDER — POTASSIUM CHLORIDE 40 MEQ/15ML (20%) PO SOLN: 40.0000 meq | Freq: Every morning | ORAL | Status: DC

## 2015-08-25 ENCOUNTER — Inpatient Hospital Stay: Payer: 59 | Admitting: Family Medicine

## 2015-08-25 ENCOUNTER — Other Ambulatory Visit: Payer: Self-pay | Admitting: Family Medicine

## 2015-08-25 ENCOUNTER — Inpatient Hospital Stay: Payer: 59

## 2015-08-25 DIAGNOSIS — C3492 Malignant neoplasm of unspecified part of left bronchus or lung: Secondary | ICD-10-CM | POA: Diagnosis not present

## 2015-08-25 LAB — BASIC METABOLIC PANEL
Anion gap: 10 (ref 5–15)
BUN: 6 mg/dL (ref 6–20)
CHLORIDE: 101 mmol/L (ref 101–111)
CO2: 22 mmol/L (ref 22–32)
Calcium: 7.4 mg/dL — ABNORMAL LOW (ref 8.9–10.3)
Creatinine, Ser: 0.56 mg/dL (ref 0.44–1.00)
GFR calc Af Amer: >60 mL/min (ref >60)
GFR calc non Af Amer: >60 mL/min (ref >60)
GLUCOSE: 166 mg/dL — AB (ref 65–99)
POTASSIUM: 3.3 mmol/L — AB (ref 3.5–5.1)
Sodium: 133 mmol/L — ABNORMAL LOW (ref 135–145)

## 2015-08-25 MED ORDER — SODIUM CHLORIDE 0.9 % IV SOLN
Freq: Once | INTRAVENOUS | Status: AC
  Administered 2015-08-25: 15:00:00 via INTRAVENOUS
  Filled 2015-08-25: qty 2

## 2015-08-25 MED ORDER — SODIUM CHLORIDE 0.9 % IV SOLN
Freq: Once | INTRAVENOUS | Status: AC
  Filled 2015-08-25: qty 2

## 2015-08-25 MED ORDER — SODIUM CHLORIDE 0.9 % IV SOLN
Freq: Once | INTRAVENOUS | Status: AC
  Filled 2015-08-25: qty 1000

## 2015-08-25 MED ORDER — HEPARIN SOD (PORK) LOCK FLUSH 100 UNIT/ML IV SOLN
500.0000 [IU] | Freq: Once | INTRAVENOUS | Status: AC
  Administered 2015-08-25: 500 [IU] via INTRAVENOUS

## 2015-08-25 MED ORDER — SODIUM CHLORIDE 0.9 % IV SOLN
Freq: Once | INTRAVENOUS | Status: AC
Start: 2015-08-25 — End: 2015-08-25
  Administered 2015-08-25: 15:00:00 via INTRAVENOUS
  Filled 2015-08-25: qty 1000

## 2015-08-25 MED ORDER — SODIUM CHLORIDE 0.9 % IJ SOLN
10.0000 mL | INTRAMUSCULAR | Status: DC | PRN
  Administered 2015-08-25: 10 mL via INTRAVENOUS
  Filled 2015-08-25: qty 10

## 2015-08-25 MED ORDER — HEPARIN SOD (PORK) LOCK FLUSH 100 UNIT/ML IV SOLN
INTRAVENOUS | Status: AC
  Filled 2015-08-25: qty 5

## 2015-08-25 NOTE — Progress Notes (Signed)
This encounter was created in error - please disregard.

## 2015-09-01 ENCOUNTER — Inpatient Hospital Stay: Payer: 59

## 2015-09-01 ENCOUNTER — Encounter: Payer: Self-pay | Admitting: Family Medicine

## 2015-09-01 ENCOUNTER — Inpatient Hospital Stay: Payer: 59 | Attending: Family Medicine

## 2015-09-01 ENCOUNTER — Inpatient Hospital Stay (HOSPITAL_BASED_OUTPATIENT_CLINIC_OR_DEPARTMENT_OTHER): Payer: 59 | Admitting: Family Medicine

## 2015-09-01 VITALS — BP 106/74 | HR 115 | Temp 96.3°F | Resp 18 | Ht 62.0 in | Wt 106.0 lb

## 2015-09-01 DIAGNOSIS — C7951 Secondary malignant neoplasm of bone: Secondary | ICD-10-CM | POA: Insufficient documentation

## 2015-09-01 DIAGNOSIS — C7931 Secondary malignant neoplasm of brain: Secondary | ICD-10-CM

## 2015-09-01 DIAGNOSIS — R630 Anorexia: Secondary | ICD-10-CM | POA: Insufficient documentation

## 2015-09-01 DIAGNOSIS — E876 Hypokalemia: Secondary | ICD-10-CM | POA: Diagnosis not present

## 2015-09-01 DIAGNOSIS — C3492 Malignant neoplasm of unspecified part of left bronchus or lung: Secondary | ICD-10-CM

## 2015-09-01 DIAGNOSIS — Z79899 Other long term (current) drug therapy: Secondary | ICD-10-CM | POA: Diagnosis not present

## 2015-09-01 DIAGNOSIS — E119 Type 2 diabetes mellitus without complications: Secondary | ICD-10-CM | POA: Insufficient documentation

## 2015-09-01 HISTORY — DX: Hypokalemia: E87.6

## 2015-09-01 LAB — BASIC METABOLIC PANEL
ANION GAP: 11 (ref 5–15)
BUN: <5 mg/dL — ABNORMAL LOW (ref 6–20)
CALCIUM: 7.4 mg/dL — AB (ref 8.9–10.3)
CO2: 22 mmol/L (ref 22–32)
CREATININE: 0.62 mg/dL (ref 0.44–1.00)
Chloride: 98 mmol/L — ABNORMAL LOW (ref 101–111)
Glucose, Bld: 145 mg/dL — ABNORMAL HIGH (ref 65–99)
Potassium: 3.2 mmol/L — ABNORMAL LOW (ref 3.5–5.1)
SODIUM: 131 mmol/L — AB (ref 135–145)

## 2015-09-01 MED ORDER — HEPARIN SOD (PORK) LOCK FLUSH 100 UNIT/ML IV SOLN
500.0000 [IU] | Freq: Once | INTRAVENOUS | Status: AC
  Administered 2015-09-01: 500 [IU] via INTRAVENOUS
  Filled 2015-09-01: qty 5

## 2015-09-01 MED ORDER — SODIUM CHLORIDE 0.9 % IV SOLN
Freq: Once | INTRAVENOUS | Status: AC
  Administered 2015-09-01: 15:00:00 via INTRAVENOUS
  Filled 2015-09-01: qty 1000

## 2015-09-01 MED ORDER — SODIUM CHLORIDE 0.9 % IJ SOLN
10.0000 mL | INTRAMUSCULAR | Status: DC | PRN
  Administered 2015-09-01: 10 mL via INTRAVENOUS
  Filled 2015-09-01: qty 10

## 2015-09-01 NOTE — Progress Notes (Signed)
[place]  Telephone:(336) [phone]  Fax:(336) [phone]     Terri Marks DOB: 1956/05/29  MR#: [id number]  YBO#:[id number]  Patient Care Team: No Pcp Per Patient as PCP - General (General Practice)  CHIEF COMPLAINT:  Chief Complaint  Patient presents with  . Lung Cancer    Left Lung  . Injections    Xgeva    INTERVAL HISTORY:  Patient is here for continued follow-up regarding stage IV adenocarcinoma of lung. 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 Patient is currently under care with hospice in her on home. She reports having continued and persistent nausea and occasional vomiting when trying to eat. Patient states that the site and or smell of food makes her once throw up.  Also reports having shortness of breath, weakness, extreme fatigue, increasing back and chest wall pain.  REVIEW OF SYSTEMS:   Review of Systems  Constitutional: Positive for weight loss and malaise/fatigue. Negative for fever, chills and diaphoresis.       Anorexia Smells and sight of food-induced vomiting  HENT: Negative for congestion, ear discharge, ear pain, hearing loss, nosebleeds, sore throat and tinnitus.   Eyes: Negative for blurred vision, double vision, photophobia, pain, discharge and redness.  Respiratory: Positive for shortness of breath. Negative for cough, hemoptysis, sputum production and stridor.        Chest wall pain  Cardiovascular: Negative for palpitations, orthopnea, claudication, leg swelling and PND.       Chest wall pain  Gastrointestinal: Positive for nausea and vomiting. Negative for heartburn, abdominal pain, diarrhea, constipation, blood in stool and melena.  Genitourinary: Negative.   Musculoskeletal: Positive for back pain.  Skin: Negative.   Neurological: Positive for weakness. Negative for dizziness, tingling, focal weakness, seizures and headaches.  Endo/Heme/Allergies:  Does not bruise/bleed easily.  Psychiatric/Behavioral: Negative for depression. The patient is nervous/anxious. The patient does not have insomnia.     As per HPI. Otherwise, a complete review of systems is negatve.   Stage IV non-small cell lung cancer with bone metastasis, adenocarcinoma (presented with multiple abnormal bone lesions seen on CT scan, and left lung mass). Left lung mass biopsy 04/16/14 - ADENOCARCINOMA,ACINAR AND MICROPAPILLARY PATTERNS. The adenocarcinoma present is morphologically consistent with a lung primary. ALK mutation positive.  Started palliative chemotherapy with Taxol/Carboplatin/Avastin on 05/12/14. Started Zometa for bone metastases on 04/24/14. Started palliative ALK inhibitor Xalkori around 4th week of November 2015. Discontinued Xalkori 01/06/15 due to side effects.  Started Zykadia (Ceritinib) around 02/17/15, but taking only 3 tablets (450 mg) daily.  Patient discontinued on her own after taking for a few days, decided to stop cancer treatments. Patient admitted to hospice services in the home in October 2016.  PAST MEDICAL HISTORY: Past Medical History  Diagnosis Date  . Diabetes mellitus without complication ([place])   . Wears glasses   . Arthritis   . Family history of anesthesia complication     sister hard to put under  . Non-small cell carcinoma of left lung, stage 4 ([place]) 03/22/2015  . Hypokalemia 09/01/2015    PAST SURGICAL HISTORY: Past Surgical History  Procedure Laterality Date  . Abdominal hysterectomy      partial  . Cesarean section      x2  . Dorsal compartment release Right 06/05/2013    Procedure: RELEASE RIGHT FIRST DORSAL COMPARTMENT (DEQUERVAIN);  Surgeon: Cammie Sickle., MD;  Location: [facility];  Service: Orthopedics;  Laterality: Right;    FAMILY HISTORY No family history on file.  GYNECOLOGIC HISTORY:  No LMP recorded. Patient has had a hysterectomy.     ADVANCED DIRECTIVES:    HEALTH  MAINTENANCE: Social History  Substance Use Topics  . Smoking status: Never Smoker   . Smokeless tobacco: Not on file  . Alcohol Use: No     Colonoscopy:  PAP:  Bone density:  Lipid panel:  Allergies  Allergen Reactions  . Bactrim [Sulfamethoxazole-Trimethoprim]     Gi upset  . Bactrim [Sulfamethoxazole-Trimethoprim]     Gi upset  . Flagyl [Metronidazole] Nausea And Vomiting  . Omnicef [Cefdinir] Diarrhea    States she will not take even if prescribed. Stomach cramps and diarrhea  . Other     omnicef-diarrhia    Current Outpatient Prescriptions  Medication Sig Dispense Refill  . fentaNYL (DURAGESIC - DOSED MCG/HR) 25 MCG/HR patch Place 1 patch (25 mcg total) onto the skin every 3 (three) days. 10 patch 0  . fluconazole (DIFLUCAN) 150 MG tablet Take 1 tablet (150 mg total) by mouth daily. 3 tablet 0  . glipiZIDE (GLUCOTROL) 5 MG tablet Take 5 mg by mouth 2 (two) times daily before a meal.    . HYDROcodone-acetaminophen (NORCO/VICODIN) 5-325 MG per tablet 1 - 2  Tablets every 4 hours as needed 30 tablet 0  . meclizine (ANTIVERT) 25 MG tablet Take 1 tablet (25 mg total) by mouth 3 (three) times daily as needed for dizziness. 30 tablet 0  . megestrol (MEGACE ORAL) 40 MG/ML suspension Take 10 mLs (400 mg total) by mouth 2 (two) times daily. 240 mL 0  . ondansetron (ZOFRAN ODT) 4 MG disintegrating tablet Take 1 tablet (4 mg total) by mouth every 8 (eight) hours as needed for nausea or vomiting. 30 tablet 0  . Potassium Chloride 40 MEQ/15ML (20%) SOLN Take 40 mEq by mouth every morning. 1 Bottle 0   Current Facility-Administered Medications  Medication Dose Route Frequency Provider Last Rate Last Dose  . sodium chloride 0.9 % 1,000 mL with potassium chloride 20 mEq infusion   Intravenous Once Evlyn Kanner, NP   Stopped at 09/01/15 1629   Facility-Administered Medications Ordered in Other Visits  Medication Dose Route Frequency Provider Last Rate Last Dose  . 0.9 %  sodium  chloride infusion   Intravenous Once Evlyn Kanner, NP      . heparin lock flush 100 unit/mL  500 Units Intravenous Once Evlyn Kanner, NP      . ondansetron (ZOFRAN) 4 mg, dexamethasone (DECADRON) 10 mg in sodium chloride 0.9 % 50 mL IVPB   Intravenous Once Evlyn Kanner, NP      . sodium chloride 0.9 % injection 10 mL  10 mL Intravenous PRN Leia Alf, MD   10 mL at 07/07/15 1459  . sodium chloride 0.9 % injection 10 mL  10 mL Intravenous PRN Evlyn Kanner, NP   10 mL at 09/01/15 1415    OBJECTIVE: BP 106/74 mmHg  Pulse 115  Temp(Src) 96.3 F (35.7 C) (Tympanic)  Resp 18  Ht $R'5\' 2"'vS$  (1.575 m)  Wt 106 lb 0.7 oz (48.1 kg)  BMI 19.39 kg/m2   Body mass index is 19.39 kg/(m^2).    ECOG FS:3 - Symptomatic, >50% confined to bed  General: Cachectic, sitting in wheelchair. Poor performance status, overall declining condition. Eyes: Pink conjunctiva, anicteric sclera. HEENT: Normocephalic, dry mucous membranes, clear oropharnyx. Lungs: Diminished bilaterally. Heart: Tachycardia. No rubs, murmurs, or  gallops. Musculoskeletal: No edema, cyanosis, or clubbing. Back and chest wall pain Neuro: Alert, answering all questions appropriately. Cranial nerves grossly intact. Skin: No rashes or petechiae noted. Psych: Normal affect.    LAB RESULTS:  Infusion on 09/01/2015  Component Date Value Ref Range Status  . Sodium 09/01/2015 131* 135 - 145 mmol/L Final  . Potassium 09/01/2015 3.2* 3.5 - 5.1 mmol/L Final  . Chloride 09/01/2015 98* 101 - 111 mmol/L Final  . CO2 09/01/2015 22  22 - 32 mmol/L Final  . Glucose, Bld 09/01/2015 145* 65 - 99 mg/dL Final  . BUN 09/01/2015 <5* 6 - 20 mg/dL Final  . Creatinine, Ser 09/01/2015 0.62  0.44 - 1.00 mg/dL Final  . Calcium 09/01/2015 7.4* 8.9 - 10.3 mg/dL Final  . GFR calc non Af Amer 09/01/2015 >60  >60 mL/min Final  . GFR calc Af Amer 09/01/2015 >60  >60 mL/min Final   Comment: (NOTE) The eGFR has been calculated using the CKD EPI  equation. This calculation has not been validated in all clinical situations. eGFR's persistently <60 mL/min signify possible Chronic Kidney Disease.   . Anion gap 09/01/2015 11  5 - 15 Final    STUDIES: No results found.  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  PLAN:   1. Lung CA. Patient has been enrolled in hospice services. Patient continues to decline in performance status. Patient is unable to eat and reports anything she tries to eat she throws up. She is requesting to continue IV fluids at least weekly. 2. Back and chest wall pain. She reports that pain is not well controlled with 25 g fentanyl patch. She does have additional 12 g fentanyl patches and has been advised to add this to the 25 g to total 37 g.  3. Anorexia. Will hydrate patient today with IV fluids as well as Zofran. 4. Hypokalemia. Will add 20 mEq of potassium to IV fluids that will be received today.  We will continue with weekly IV fluids for patient, discussed with patient that this would not be a long-term solution and that lack of appetite and inability to eat may be related to natural disease progression and end-of-life. Continue with hospice services in the home. If pain is not well managed with 37 g of fentanyl we can increase as needed.  Patient expressed understanding and was in agreement with this plan. She also understands that She can call clinic at any time with any questions, concerns, or complaints.   Dr. Oliva Bustard was available for consultation and review of plan of care for this patient.   Evlyn Kanner, NP   09/01/2015 3:55 PM

## 2015-09-01 NOTE — Progress Notes (Signed)
Patient is here for follow-up of lung cancer and xgeva treatment. Patient states that her appetite has been very poor. She states that she feels hungry, but then when she tries to eat she gets very nauseous and vomits. Patient states that she has not eaten anything today. She has been very weak because she is not eating and not drinking much. Patient has pain in the left side of her chest and in her back. She rates her pain a 7/10 on most days.

## 2015-09-10 ENCOUNTER — Inpatient Hospital Stay: Payer: 59

## 2015-09-10 DIAGNOSIS — C3492 Malignant neoplasm of unspecified part of left bronchus or lung: Secondary | ICD-10-CM | POA: Diagnosis not present

## 2015-09-10 DIAGNOSIS — E876 Hypokalemia: Secondary | ICD-10-CM

## 2015-09-10 MED ORDER — MORPHINE SULFATE (PF) 2 MG/ML IV SOLN
INTRAVENOUS | Status: AC
  Filled 2015-09-10: qty 1

## 2015-09-10 MED ORDER — MORPHINE SULFATE 2 MG/ML IJ SOLN
4.0000 mg | Freq: Once | INTRAMUSCULAR | Status: AC
  Administered 2015-09-10: 4 mg via INTRAVENOUS
  Filled 2015-09-10: qty 2

## 2015-09-10 MED ORDER — SODIUM CHLORIDE 0.9 % IV SOLN
Freq: Once | INTRAVENOUS | Status: AC
  Administered 2015-09-10: 15:00:00 via INTRAVENOUS
  Filled 2015-09-10: qty 2

## 2015-09-10 MED ORDER — SODIUM CHLORIDE 0.9 % IJ SOLN
10.0000 mL | Freq: Once | INTRAMUSCULAR | Status: AC
  Administered 2015-09-10: 10 mL via INTRAVENOUS
  Filled 2015-09-10: qty 10

## 2015-09-10 MED ORDER — HEPARIN SOD (PORK) LOCK FLUSH 100 UNIT/ML IV SOLN
INTRAVENOUS | Status: AC
  Filled 2015-09-10: qty 5

## 2015-09-10 MED ORDER — SODIUM CHLORIDE 0.9 % IV SOLN
Freq: Once | INTRAVENOUS | Status: AC
  Administered 2015-09-10: 15:00:00 via INTRAVENOUS
  Filled 2015-09-10: qty 1000

## 2015-09-10 MED ORDER — HEPARIN SOD (PORK) LOCK FLUSH 100 UNIT/ML IV SOLN
500.0000 [IU] | Freq: Once | INTRAVENOUS | Status: AC
  Administered 2015-09-10: 500 [IU] via INTRAVENOUS

## 2015-09-10 NOTE — Addendum Note (Signed)
Addended by: Nathaneil Canary on: 09/10/2015 04:32 PM   Modules accepted: Orders

## 2015-09-13 ENCOUNTER — Telehealth: Payer: Self-pay | Admitting: *Deleted

## 2015-09-13 MED ORDER — FENTANYL 12 MCG/HR TD PT72: 12.5000 ug | MEDICATED_PATCH | TRANSDERMAL | Status: DC

## 2015-09-13 NOTE — Telephone Encounter (Signed)
faxed

## 2015-09-17 ENCOUNTER — Other Ambulatory Visit: Payer: Self-pay | Admitting: Family Medicine

## 2015-09-17 ENCOUNTER — Inpatient Hospital Stay: Payer: 59

## 2015-09-17 VITALS — BP 105/61 | HR 103 | Temp 98.7°F | Resp 18

## 2015-09-17 DIAGNOSIS — C3492 Malignant neoplasm of unspecified part of left bronchus or lung: Secondary | ICD-10-CM | POA: Diagnosis not present

## 2015-09-17 MED ORDER — HEPARIN SOD (PORK) LOCK FLUSH 100 UNIT/ML IV SOLN
500.0000 [IU] | Freq: Once | INTRAVENOUS | Status: AC
  Administered 2015-09-17: 500 [IU] via INTRAVENOUS
  Filled 2015-09-17: qty 5

## 2015-09-17 MED ORDER — SODIUM CHLORIDE 0.9 % IJ SOLN
10.0000 mL | INTRAMUSCULAR | Status: DC | PRN
  Administered 2015-09-17: 10 mL via INTRAVENOUS
  Filled 2015-09-17: qty 10

## 2015-09-17 MED ORDER — SODIUM CHLORIDE 0.9 % IV SOLN
Freq: Once | INTRAVENOUS | Status: AC
  Administered 2015-09-17: 15:00:00 via INTRAVENOUS
  Filled 2015-09-17: qty 1000

## 2015-09-27 ENCOUNTER — Telehealth: Payer: Self-pay | Admitting: *Deleted

## 2015-09-27 NOTE — Telephone Encounter (Signed)
Hospice will give IVF at home. Magda Paganini will send orders

## 2015-09-27 NOTE — Telephone Encounter (Signed)
I have a call in to [facility] her hospice nurse to see if they can give IVF in home

## 2015-10-01 ENCOUNTER — Encounter: Payer: Self-pay | Admitting: Internal Medicine

## 2015-10-01 ENCOUNTER — Inpatient Hospital Stay: Payer: 59 | Attending: Internal Medicine

## 2015-10-01 ENCOUNTER — Inpatient Hospital Stay (HOSPITAL_BASED_OUTPATIENT_CLINIC_OR_DEPARTMENT_OTHER): Payer: 59 | Admitting: Internal Medicine

## 2015-10-01 VITALS — BP 105/75 | HR 120 | Temp 97.8°F

## 2015-10-01 DIAGNOSIS — E119 Type 2 diabetes mellitus without complications: Secondary | ICD-10-CM | POA: Diagnosis not present

## 2015-10-01 DIAGNOSIS — E876 Hypokalemia: Secondary | ICD-10-CM | POA: Diagnosis not present

## 2015-10-01 DIAGNOSIS — R809 Proteinuria, unspecified: Secondary | ICD-10-CM | POA: Insufficient documentation

## 2015-10-01 DIAGNOSIS — Z515 Encounter for palliative care: Secondary | ICD-10-CM | POA: Diagnosis not present

## 2015-10-01 DIAGNOSIS — C3492 Malignant neoplasm of unspecified part of left bronchus or lung: Secondary | ICD-10-CM | POA: Diagnosis not present

## 2015-10-01 DIAGNOSIS — Z79899 Other long term (current) drug therapy: Secondary | ICD-10-CM

## 2015-10-01 DIAGNOSIS — R0602 Shortness of breath: Secondary | ICD-10-CM | POA: Insufficient documentation

## 2015-10-01 DIAGNOSIS — R112 Nausea with vomiting, unspecified: Secondary | ICD-10-CM | POA: Diagnosis not present

## 2015-10-01 DIAGNOSIS — E538 Deficiency of other specified B group vitamins: Secondary | ICD-10-CM | POA: Insufficient documentation

## 2015-10-01 DIAGNOSIS — K5792 Diverticulitis of intestine, part unspecified, without perforation or abscess without bleeding: Secondary | ICD-10-CM | POA: Insufficient documentation

## 2015-10-01 DIAGNOSIS — R079 Chest pain, unspecified: Secondary | ICD-10-CM

## 2015-10-01 DIAGNOSIS — R627 Adult failure to thrive: Secondary | ICD-10-CM | POA: Insufficient documentation

## 2015-10-01 LAB — BASIC METABOLIC PANEL
ANION GAP: 13 (ref 5–15)
CALCIUM: 8.8 mg/dL — AB (ref 8.9–10.3)
CO2: 26 mmol/L (ref 22–32)
Chloride: 97 mmol/L — ABNORMAL LOW (ref 101–111)
Creatinine, Ser: 0.67 mg/dL (ref 0.44–1.00)
GFR calc Af Amer: >60 mL/min (ref >60)
GLUCOSE: 165 mg/dL — AB (ref 65–99)
POTASSIUM: 3.2 mmol/L — AB (ref 3.5–5.1)
SODIUM: 136 mmol/L (ref 135–145)

## 2015-10-01 LAB — CBC WITH DIFFERENTIAL/PLATELET
BASOS ABS: 0 10*3/uL (ref 0–0.1)
Basophils Relative: 1 %
EOS ABS: 0.1 10*3/uL (ref 0–0.7)
EOS PCT: 2 %
HCT: 39.2 % (ref 35.0–47.0)
Hemoglobin: 13 g/dL (ref 12.0–16.0)
LYMPHS PCT: 28 %
Lymphs Abs: 1.1 10*3/uL (ref 1.0–3.6)
MCH: 28.4 pg (ref 26.0–34.0)
MCHC: 33 g/dL (ref 32.0–36.0)
MCV: 86.1 fL (ref 80.0–100.0)
MONO ABS: 0.4 10*3/uL (ref 0.2–0.9)
Monocytes Relative: 10 %
Neutro Abs: 2.2 10*3/uL (ref 1.4–6.5)
Neutrophils Relative %: 59 %
PLATELETS: 229 10*3/uL (ref 150–440)
RBC: 4.56 MIL/uL (ref 3.80–5.20)
RDW: 14.4 % (ref 11.5–14.5)
WBC: 3.7 10*3/uL (ref 3.6–11.0)

## 2015-10-01 MED ORDER — PREDNISONE 20 MG PO TABS: 20.0000 mg | ORAL_TABLET | Freq: Every day | ORAL | Status: AC

## 2015-10-01 NOTE — Progress Notes (Signed)
[place] OFFICE PROGRESS NOTE  Patient Care Team: No Pcp Per Patient as PCP - General (General Practice)   SUMMARY OF ONCOLOGIC HISTORY:  # LUNG ADENO CA Stage IV; status post whole brain radiation.  July 2016- Ct chest- Progression on Hospice    INTERVAL HISTORY:  This is my first interaction with the patient since I joined the practice September 2016. I reviewed the patient's prior charts/pertinent labs/imaging in detail; findings are summarized above.   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   Patient has multitude of complaints including- poor appetite; profound weight loss; chronic nausea. Patient also had episode of vomiting in the clinic. She is noted to have progressive shortness of breath; and also worsening left-sided chest pain.  She has been recently been evaluated by ophthalmology for visual loss; this is thought to be from her malignancy the brain. No seizures.   REVIEW OF SYSTEMS:  A complete 10 point review of system is done which is negative except mentioned above/history of present illness.   PAST MEDICAL HISTORY :  Past Medical History  Diagnosis Date  . Diabetes mellitus without complication ([place])   . Wears glasses   . Arthritis   . Family history of anesthesia complication     sister hard to put under  . Non-small cell carcinoma of left lung, stage 4 ([place]) 03/22/2015  . Hypokalemia 09/01/2015    PAST SURGICAL HISTORY :   Past Surgical History  Procedure Laterality Date  . Abdominal hysterectomy      partial  . Cesarean section      x2  . Dorsal compartment release Right 06/05/2013    Procedure: RELEASE RIGHT FIRST DORSAL COMPARTMENT (DEQUERVAIN);  Surgeon: Cammie Sickle., MD;  Location: [facility];  Service: Orthopedics;  Laterality: Right;    FAMILY HISTORY :  No family history on file.  SOCIAL HISTORY:    Social History  Substance Use Topics  . Smoking status: Never Smoker   . Smokeless tobacco: Not on file  . Alcohol Use: No    ALLERGIES:  is allergic to bactrim; bactrim; flagyl; omnicef; and other.  MEDICATIONS:  Current Outpatient Prescriptions  Medication Sig Dispense Refill  . fluconazole (DIFLUCAN) 150 MG tablet Take 1 tablet (150 mg total) by mouth daily. 3 tablet 0  . glipiZIDE (GLUCOTROL) 5 MG tablet Take 5 mg by mouth 2 (two) times daily before a meal.    . HYDROcodone-acetaminophen (NORCO/VICODIN) 5-325 MG per tablet 1 - 2  Tablets every 4 hours as needed 30 tablet 0  . meclizine (ANTIVERT) 25 MG tablet Take 1 tablet (25 mg total) by mouth 3 (three) times daily as needed for dizziness. 30 tablet 0  . megestrol (MEGACE ORAL) 40 MG/ML suspension Take 10 mLs (400 mg total) by mouth 2 (two) times daily. 240 mL 0  . ondansetron (ZOFRAN ODT) 4 MG disintegrating tablet Take 1 tablet (4 mg total) by mouth every 8 (eight) hours as needed for nausea or vomiting. 30 tablet 0  . fentaNYL (DURAGESIC - DOSED MCG/HR) 12 MCG/HR Place 1 patch (12.5 mcg total) onto the skin every 3 (three) days. Use with a 25 mcg patch for a total dose of 37.63mg 5 patch 0  . fentaNYL (DURAGESIC - DOSED MCG/HR) 25 MCG/HR patch Place 1 patch (25 mcg total) onto the skin every 3 (three) days. 10 patch 0   No  current facility-administered medications for this visit.   Facility-Administered Medications Ordered in Other Visits  Medication Dose Route Frequency Provider Last Rate Last Dose  . 0.9 %  sodium chloride infusion   Intravenous Once Evlyn Kanner, NP      . ondansetron (ZOFRAN) 4 mg, dexamethasone (DECADRON) 10 mg in sodium chloride 0.9 % 50 mL IVPB   Intravenous Once Evlyn Kanner, NP      . sodium chloride 0.9 % injection 10 mL  10 mL Intravenous PRN Leia Alf, MD   10 mL at 07/07/15 1459    PHYSICAL EXAMINATION: ECOG PERFORMANCE STATUS:  BP 105/75 mmHg  Pulse 120  Temp(Src) 97.8 F  (36.6 C) (Tympanic)  There were no vitals filed for this visit.  GENERAL: Cachectic-appearing female patient; no acute discomfort. Complaining of nausea. Accompanied by her husband.   LABORATORY DATA:  I have reviewed the data as listed    Component Value Date/Time   NA 136 10/01/2015 1410   NA 134* 02/22/2015 1335   K 3.2* 10/01/2015 1410   K 3.8 02/22/2015 1335   CL 97* 10/01/2015 1410   CL 100* 02/22/2015 1335   CO2 26 10/01/2015 1410   CO2 26 02/22/2015 1335   GLUCOSE 165* 10/01/2015 1410   GLUCOSE 357* 02/22/2015 1335   BUN <5* 10/01/2015 1410   BUN 13 02/22/2015 1335   CREATININE 0.67 10/01/2015 1410   CREATININE 0.97 02/22/2015 1335   CALCIUM 8.8* 10/01/2015 1410   CALCIUM 9.4 02/22/2015 1335   PROT 6.8 08/11/2015 1314   PROT 7.2 02/22/2015 1335   ALBUMIN 3.6 08/11/2015 1314   ALBUMIN 4.1 02/22/2015 1335   AST 13* 08/11/2015 1314   AST 16 02/22/2015 1335   ALT 6* 08/11/2015 1314   ALT 14 02/22/2015 1335   ALKPHOS 84 08/11/2015 1314   ALKPHOS 74 02/22/2015 1335   BILITOT 0.8 08/11/2015 1314   BILITOT 0.4 02/22/2015 1335   GFRNONAA >60 10/01/2015 1410   GFRNONAA >60 02/22/2015 1335   GFRNONAA >60 11/25/2014 1458   GFRAA >60 10/01/2015 1410   GFRAA >60 02/22/2015 1335   GFRAA >60 11/25/2014 1458    No results found for: SPEP, UPEP  Lab Results  Component Value Date   WBC 3.7 10/01/2015   NEUTROABS 2.2 10/01/2015   HGB 13.0 10/01/2015   HCT 39.2 10/01/2015   MCV 86.1 10/01/2015   PLT 229 10/01/2015      Chemistry      Component Value Date/Time   NA 136 10/01/2015 1410   NA 134* 02/22/2015 1335   K 3.2* 10/01/2015 1410   K 3.8 02/22/2015 1335   CL 97* 10/01/2015 1410   CL 100* 02/22/2015 1335   CO2 26 10/01/2015 1410   CO2 26 02/22/2015 1335   BUN <5* 10/01/2015 1410   BUN 13 02/22/2015 1335   CREATININE 0.67 10/01/2015 1410   CREATININE 0.97 02/22/2015 1335      Component Value Date/Time   CALCIUM 8.8* 10/01/2015 1410   CALCIUM 9.4  02/22/2015 1335   ALKPHOS 84 08/11/2015 1314   ALKPHOS 74 02/22/2015 1335   AST 13* 08/11/2015 1314   AST 16 02/22/2015 1335   ALT 6* 08/11/2015 1314   ALT 14 02/22/2015 1335   BILITOT 0.8 08/11/2015 1314   BILITOT 0.4 02/22/2015 1335       RADIOGRAPHIC STUDIES: I have personally reviewed the radiological images as listed and agreed with the findings in the report. No results found.   ASSESSMENT &  PLAN:   # Metastatic adenocarcinoma the lung-progression of disease noted on a CAT scan in July 2016. Patient is on hospice. Clinically patient seems to progressing given her multiple worsening signs and symptoms. I would recommend continued hospice.  # With regards to failure to thrive/nausea/worsening pain- I would recommend a trial of prednisone 20 mg once a day.   # Continued pain in the left chest area- continue current pain medication through hospice. Hospice will contact us if needed; to increase the dose of medication.  # I had a long discussion with the patient has been regarding the clinical deterioration because of progression of her lung cancer. Patient and husband understand; in fact patient is coping well. Given the fairly rapid clinical deterioration over the last few weeks- I think the prognosis is in the order few Weeks. This was discussed with the patient/husband at length.  I will not make any follow-up appointments.    No orders of the defined types were placed in this encounter.   # 40 minutes face-to-face with the patient discussing the above plan of care; more than 50% of time spent on prognosis/ natural history; counseling and coordination.      Cammie Sickle, MD 10/01/2015 2:48 PM

## 2015-10-06 ENCOUNTER — Telehealth: Payer: Self-pay | Admitting: *Deleted

## 2015-10-06 MED ORDER — FENTANYL 50 MCG/HR TD PT72: 50.0000 ug | MEDICATED_PATCH | TRANSDERMAL | Status: DC

## 2015-10-06 NOTE — Telephone Encounter (Signed)
Per L Herring, AGNP -C increase fentanyl to 50 mcg. Tina notified and rx faxed

## 2015-10-06 NOTE — Telephone Encounter (Signed)
Having increased pain in left hip and feels the Fentanyl is not controlling her pain. Can dose be increased? She has not been using her fentanyl as ordered until the past several days. Her last Fentanyl 25 mcg fill was on 10/12. She tells th enurse that she currently has a 12  And 25 mcg patch on and it is not helping. She has no more 12 mcg patches and only 1 25 mcg patch left

## 2015-10-12 ENCOUNTER — Telehealth: Payer: Self-pay | Admitting: *Deleted

## 2015-10-12 DIAGNOSIS — C3492 Malignant neoplasm of unspecified part of left bronchus or lung: Secondary | ICD-10-CM

## 2015-10-12 DIAGNOSIS — E538 Deficiency of other specified B group vitamins: Secondary | ICD-10-CM

## 2015-10-12 NOTE — Telephone Encounter (Signed)
CAlled to report that she has been giving IVF in home for a couple of weeks and it does not seem to help. Asking that we check labs at her appt tomorrow and give IVF and meds here. Reports that she needs a written rx for a wheelchair to take with her tomorrow and that she is complaining of chest wall pain an dwill not use the Roxanol in the comfort kit. Pleas ediscuss pain management with her at tomorrows appt

## 2015-10-13 ENCOUNTER — Telehealth: Payer: Self-pay | Admitting: *Deleted

## 2015-10-13 ENCOUNTER — Inpatient Hospital Stay: Payer: 59

## 2015-10-13 ENCOUNTER — Inpatient Hospital Stay (HOSPITAL_BASED_OUTPATIENT_CLINIC_OR_DEPARTMENT_OTHER): Payer: 59 | Admitting: Internal Medicine

## 2015-10-13 VITALS — BP 130/82 | HR 120 | Temp 97.4°F

## 2015-10-13 DIAGNOSIS — R627 Adult failure to thrive: Secondary | ICD-10-CM | POA: Diagnosis not present

## 2015-10-13 DIAGNOSIS — R079 Chest pain, unspecified: Secondary | ICD-10-CM | POA: Diagnosis not present

## 2015-10-13 DIAGNOSIS — C3492 Malignant neoplasm of unspecified part of left bronchus or lung: Secondary | ICD-10-CM

## 2015-10-13 DIAGNOSIS — E538 Deficiency of other specified B group vitamins: Secondary | ICD-10-CM

## 2015-10-13 DIAGNOSIS — R112 Nausea with vomiting, unspecified: Secondary | ICD-10-CM

## 2015-10-13 DIAGNOSIS — R0602 Shortness of breath: Secondary | ICD-10-CM

## 2015-10-13 DIAGNOSIS — Z515 Encounter for palliative care: Secondary | ICD-10-CM

## 2015-10-13 DIAGNOSIS — Z79899 Other long term (current) drug therapy: Secondary | ICD-10-CM

## 2015-10-13 LAB — COMPREHENSIVE METABOLIC PANEL
ALBUMIN: 3.5 g/dL (ref 3.5–5.0)
ALK PHOS: 94 U/L (ref 38–126)
ALT: 8 U/L — ABNORMAL LOW (ref 14–54)
ANION GAP: 17 — AB (ref 5–15)
AST: 15 U/L (ref 15–41)
BILIRUBIN TOTAL: 0.5 mg/dL (ref 0.3–1.2)
CALCIUM: 8.5 mg/dL — AB (ref 8.9–10.3)
CO2: 19 mmol/L — ABNORMAL LOW (ref 22–32)
Chloride: 99 mmol/L — ABNORMAL LOW (ref 101–111)
Creatinine, Ser: 0.6 mg/dL (ref 0.44–1.00)
GFR calc Af Amer: >60 mL/min (ref >60)
GLUCOSE: 218 mg/dL — AB (ref 65–99)
POTASSIUM: 3.1 mmol/L — AB (ref 3.5–5.1)
Sodium: 135 mmol/L (ref 135–145)
TOTAL PROTEIN: 7.1 g/dL (ref 6.5–8.1)

## 2015-10-13 LAB — CBC WITH DIFFERENTIAL/PLATELET
BASOS ABS: 0 10*3/uL (ref 0–0.1)
BASOS PCT: 1 %
EOS PCT: 2 %
Eosinophils Absolute: 0.1 10*3/uL (ref 0–0.7)
HCT: 40.7 % (ref 35.0–47.0)
Hemoglobin: 13.4 g/dL (ref 12.0–16.0)
LYMPHS PCT: 20 %
Lymphs Abs: 1 10*3/uL (ref 1.0–3.6)
MCH: 28.1 pg (ref 26.0–34.0)
MCHC: 33 g/dL (ref 32.0–36.0)
MCV: 85.3 fL (ref 80.0–100.0)
MONO ABS: 0.5 10*3/uL (ref 0.2–0.9)
Monocytes Relative: 10 %
NEUTROS ABS: 3.5 10*3/uL (ref 1.4–6.5)
Neutrophils Relative %: 67 %
PLATELETS: 166 10*3/uL (ref 150–440)
RBC: 4.77 MIL/uL (ref 3.80–5.20)
RDW: 15.5 % — AB (ref 11.5–14.5)
WBC: 5.1 10*3/uL (ref 3.6–11.0)

## 2015-10-13 MED ORDER — HEPARIN SOD (PORK) LOCK FLUSH 100 UNIT/ML IV SOLN
500.0000 [IU] | Freq: Once | INTRAVENOUS | Status: AC
  Administered 2015-10-13: 500 [IU] via INTRAVENOUS

## 2015-10-13 MED ORDER — SODIUM CHLORIDE 0.9 % IV SOLN: 10.0000 mg | Freq: Once | INTRAVENOUS | Status: DC

## 2015-10-13 MED ORDER — PROMETHAZINE HCL 25 MG RE SUPP: 25.0000 mg | Freq: Four times a day (QID) | RECTAL | Status: AC | PRN

## 2015-10-13 MED ORDER — SODIUM CHLORIDE 0.9 % IV SOLN
Freq: Once | INTRAVENOUS | Status: AC
  Administered 2015-10-13: 14:00:00 via INTRAVENOUS
  Filled 2015-10-13: qty 4

## 2015-10-13 MED ORDER — PROMETHAZINE HCL 25 MG/ML IJ SOLN
25.0000 mg | Freq: Once | INTRAMUSCULAR | Status: AC
  Administered 2015-10-13: 25 mg via INTRAVENOUS
  Filled 2015-10-13: qty 1

## 2015-10-13 MED ORDER — SCOPOLAMINE 1 MG/3DAYS TD PT72: 1.0000 | MEDICATED_PATCH | TRANSDERMAL | Status: AC

## 2015-10-13 MED ORDER — SODIUM CHLORIDE 0.9 % IV SOLN
Freq: Once | INTRAVENOUS | Status: AC
  Administered 2015-10-13: 13:00:00 via INTRAVENOUS
  Filled 2015-10-13: qty 1000

## 2015-10-13 MED ORDER — SODIUM CHLORIDE 0.9 % IJ SOLN
10.0000 mL | Freq: Once | INTRAMUSCULAR | Status: AC
  Administered 2015-10-13: 10 mL via INTRAVENOUS
  Filled 2015-10-13: qty 10

## 2015-10-13 MED ORDER — SODIUM CHLORIDE 0.9 % IV SOLN: Freq: Once | INTRAVENOUS | Status: DC

## 2015-10-13 MED ORDER — HEPARIN SOD (PORK) LOCK FLUSH 100 UNIT/ML IV SOLN
500.0000 [IU] | Freq: Once | INTRAVENOUS | Status: DC | PRN
  Filled 2015-10-13: qty 5

## 2015-10-13 NOTE — Addendum Note (Signed)
Addended by: Betti Cruz on: 10/13/2015 09:08 AM   Modules accepted: Orders

## 2015-10-13 NOTE — Telephone Encounter (Signed)
Pt here in clinic today and after speaking to pt and family. Pt feels hungry wants to eat but can't she vomits eavh time she swallows water even though she states she is not nauseated.  Dr. Rogue Bussing asked if she could get fluids 2-3 times week where right now she is getting it once a week.  He has also prescribed phenergan supp. And scopolamine patches.  Ivin Booty asked that i write order and send it to hospice.

## 2015-10-13 NOTE — Progress Notes (Signed)
[place] OFFICE PROGRESS NOTE  Patient Care Team: No Pcp Per Patient as PCP - General (General Practice)   SUMMARY OF ONCOLOGIC HISTORY:  # LUNG ADENO CA Stage IV; status post whole brain radiation.  July 2016- Ct chest- Progression on Hospice    INTERVAL HISTORY:  Very pleasant but unfortunate 59 year female patient with above history of metastatic adenocarcinoma lung currently on hospice is here for follow-up/  Nausea vomiting. Patient is accompanied by multiple family members.   Patient's sister-  Wanting  To know regarding artificial feeding- feeding tube/ TPN   Patient continues to have multiple complaints including- poor appetite; profound weight loss; chronic nausea. Patient also had episode of vomiting in the clinic. She is noted to have progressive shortness of breath; and also worsening left-sided chest pain.   However today her complaint continues to be worsening loss of appetite;  Nausea vomiting.   REVIEW OF SYSTEMS:  A complete 10 point review of system is done which is negative except mentioned above/history of present illness.   PAST MEDICAL HISTORY :  Past Medical History  Diagnosis Date  . Diabetes mellitus without complication ([place])   . Wears glasses   . Arthritis   . Family history of anesthesia complication     sister hard to put under  . Non-small cell carcinoma of left lung, stage 4 ([place]) 03/22/2015  . Hypokalemia 09/01/2015    PAST SURGICAL HISTORY :   Past Surgical History  Procedure Laterality Date  . Abdominal hysterectomy      partial  . Cesarean section      x2  . Dorsal compartment release Right 06/05/2013    Procedure: RELEASE RIGHT FIRST DORSAL COMPARTMENT (DEQUERVAIN);  Surgeon: Cammie Sickle., MD;  Location: [facility];  Service: Orthopedics;  Laterality: Right;    FAMILY HISTORY :  No family history on file.  SOCIAL HISTORY:   Social History  Substance Use Topics  . Smoking status: Never Smoker    . Smokeless tobacco: Not on file  . Alcohol Use: No    ALLERGIES:  is allergic to bactrim; bactrim; flagyl; omnicef; and other.  MEDICATIONS:  Current Outpatient Prescriptions  Medication Sig Dispense Refill  . fentaNYL (DURAGESIC) 50 MCG/HR Place 1 patch (50 mcg total) onto the skin every 3 (three) days. 5 patch 0  . predniSONE (DELTASONE) 20 MG tablet Take 1 tablet (20 mg total) by mouth daily with breakfast. 20 tablet 0   No current facility-administered medications for this visit.   Facility-Administered Medications Ordered in Other Visits  Medication Dose Route Frequency Provider Last Rate Last Dose  . 0.9 %  sodium chloride infusion   Intravenous Once Evlyn Kanner, NP      . heparin lock flush 100 unit/mL  500 Units Intravenous Once Cammie Sickle, MD      . ondansetron (ZOFRAN) 4 mg, dexamethasone (DECADRON) 10 mg in sodium chloride 0.9 % 50 mL IVPB   Intravenous Once Evlyn Kanner, NP      . sodium chloride 0.9 % injection 10 mL  10 mL Intravenous PRN Leia Alf, MD   10 mL at 07/07/15 1459    PHYSICAL EXAMINATION: ECOG PERFORMANCE STATUS:  BP 130/82 mmHg  Pulse 120  Temp(Src) 97.4 F (36.3 C)  There were no vitals filed for this visit.  GENERAL: Cachectic-appearing female patient; no acute discomfort. Complaining of nausea. Accompanied by her husband/sister/ niece.    LABORATORY DATA:  I have  reviewed the data as listed    Component Value Date/Time   NA 135 10/13/2015 1117   NA 134* 02/22/2015 1335   K 3.1* 10/13/2015 1117   K 3.8 02/22/2015 1335   CL 99* 10/13/2015 1117   CL 100* 02/22/2015 1335   CO2 19* 10/13/2015 1117   CO2 26 02/22/2015 1335   GLUCOSE 218* 10/13/2015 1117   GLUCOSE 357* 02/22/2015 1335   BUN <5* 10/13/2015 1117   BUN 13 02/22/2015 1335   CREATININE 0.60 10/13/2015 1117   CREATININE 0.97 02/22/2015 1335   CALCIUM 8.5* 10/13/2015 1117   CALCIUM 9.4 02/22/2015 1335   PROT 7.1 10/13/2015 1117   PROT 7.2 02/22/2015  1335   ALBUMIN 3.5 10/13/2015 1117   ALBUMIN 4.1 02/22/2015 1335   AST 15 10/13/2015 1117   AST 16 02/22/2015 1335   ALT 8* 10/13/2015 1117   ALT 14 02/22/2015 1335   ALKPHOS 94 10/13/2015 1117   ALKPHOS 74 02/22/2015 1335   BILITOT 0.5 10/13/2015 1117   BILITOT 0.4 02/22/2015 1335   GFRNONAA >60 10/13/2015 1117   GFRNONAA >60 02/22/2015 1335   GFRNONAA >60 11/25/2014 1458   GFRAA >60 10/13/2015 1117   GFRAA >60 02/22/2015 1335   GFRAA >60 11/25/2014 1458    No results found for: SPEP, UPEP  Lab Results  Component Value Date   WBC 5.1 10/13/2015   NEUTROABS 3.5 10/13/2015   HGB 13.4 10/13/2015   HCT 40.7 10/13/2015   MCV 85.3 10/13/2015   PLT 166 10/13/2015      Chemistry      Component Value Date/Time   NA 135 10/13/2015 1117   NA 134* 02/22/2015 1335   K 3.1* 10/13/2015 1117   K 3.8 02/22/2015 1335   CL 99* 10/13/2015 1117   CL 100* 02/22/2015 1335   CO2 19* 10/13/2015 1117   CO2 26 02/22/2015 1335   BUN <5* 10/13/2015 1117   BUN 13 02/22/2015 1335   CREATININE 0.60 10/13/2015 1117   CREATININE 0.97 02/22/2015 1335      Component Value Date/Time   CALCIUM 8.5* 10/13/2015 1117   CALCIUM 9.4 02/22/2015 1335   ALKPHOS 94 10/13/2015 1117   ALKPHOS 74 02/22/2015 1335   AST 15 10/13/2015 1117   AST 16 02/22/2015 1335   ALT 8* 10/13/2015 1117   ALT 14 02/22/2015 1335   BILITOT 0.5 10/13/2015 1117   BILITOT 0.4 02/22/2015 1335       RADIOGRAPHIC STUDIES: I have personally reviewed the radiological images as listed and agreed with the findings in the report. No results found.   ASSESSMENT & PLAN:   # Metastatic adenocarcinoma the lung-progression of disease noted on a CAT scan in July 2016. Patient is on hospice. Clinically patient seems to progressing given her multiple worsening signs and symptoms. I would recommend continued hospice.  # With regards to failure to thrive/nausea/worsening pain--  I would recommend IV fluids/ antiemetics/ dexamethasone  today.  As per the family request,  We'll try to increase the frequency of IV fluids at home.  Also recommend  Phenergan suppository/ also scopolamine patch  # Continued pain in the left chest area- continue current pain medication through hospice. Hospice will contact us if needed; to increase the dose of medication.  # I had a long discussion with the patient/ family/sisterhas been regarding the clinical deterioration because of progression of her lung cancer.    Patient's sister insisting on TPN/  Tube feeding.  I had a long discussion-  That there is no proven benefit of TPN/ artificial feeding and hospice patients.  I again discussed that this is likely from progression of her metastatic cancer. I will not make any follow-up appointments.    No orders of the defined types were placed in this encounter.   # 25  minutes face-to-face with the patient discussing the above plan of care; more than 50% of time spent on prognosis/ natural history; counseling and coordination.      Cammie Sickle, MD 10/13/2015 11:54 AM

## 2015-11-03 ENCOUNTER — Telehealth: Payer: Self-pay | Admitting: *Deleted

## 2015-11-03 ENCOUNTER — Other Ambulatory Visit: Payer: Self-pay | Admitting: Family Medicine

## 2015-11-03 DIAGNOSIS — C3492 Malignant neoplasm of unspecified part of left bronchus or lung: Secondary | ICD-10-CM

## 2015-11-03 MED ORDER — HEPARIN & NACL LOCK FLUSH 100-0.9 UNIT/ML-% IV KIT: 1.0000 | PACK | INTRAVENOUS | Status: AC | PRN

## 2015-11-03 MED ORDER — FENTANYL 50 MCG/HR TD PT72: 50.0000 ug | MEDICATED_PATCH | TRANSDERMAL | Status: AC

## 2015-11-03 NOTE — Telephone Encounter (Signed)
[facility] confirmed that wheelchair and shower chair order has been sent. However, [facility] does not contact with [facility]. I explained to Will that Hospice contacted cancer center today stating they are unable to supply this medical equipment. The patient's private insurance will need to cover this equipment per hospice. I gave [facility] the phone number to contact hospice.

## 2015-11-03 NOTE — Telephone Encounter (Signed)
Hospice Rn called. Needs the following prescriptions filled.  1. Wheelchair (standard and tub seat w/o back)-RX needs to faxed to [facility] as Hospice does not carry this supply. 2. Duragesic Patch 50 mcg- RX needs to be sent to [facility] 3. Heparin and NS Flushes for hospice nurse to flush port.-send to [facility]

## 2015-11-03 NOTE — Telephone Encounter (Signed)
Magda Paganini, NP signed written Rx for wheelchair and tube seat. Duragesic patch faxed to pharmacy. Heparin/NS flushes RX sent to pharmacy via escribe.

## 2015-11-10 ENCOUNTER — Inpatient Hospital Stay: Payer: 59

## 2015-11-10 ENCOUNTER — Inpatient Hospital Stay: Payer: 59 | Admitting: Internal Medicine

## 2015-11-19 ENCOUNTER — Other Ambulatory Visit: Payer: 59

## 2015-11-19 ENCOUNTER — Ambulatory Visit: Payer: 59 | Admitting: Internal Medicine

## 2015-12-01 DEATH — deceased

## 2015-12-02 ENCOUNTER — Other Ambulatory Visit: Payer: Self-pay | Admitting: Internal Medicine

## 2016-09-05 IMAGING — [place] NUCLEAR MEDICINE WHOLE BODY BONE SCINTIGRAPHY
2 series · 10 of 10 positions shown · non-contrast
Comparison: Nuclear bone scan dated April 07, 2014

CLINICAL DATA: Stage IV non-small cell lung malignancy with known
bone metastases, worsening right hip and thigh and left back pain

EXAM:
NUCLEAR MEDICINE WHOLE BODY BONE SCAN
TECHNIQUE: Whole body anterior and posterior images were obtained approximately
3 hours after intravenous injection of radiopharmaceutical.
RADIOPHARMACEUTICALS:  23.44 mCi Uechnetium-44 MDP

[Series 1000: 3 hr wholebody · 2.40mm/px · 2 of 2 frames shown]
[frame 1/2]
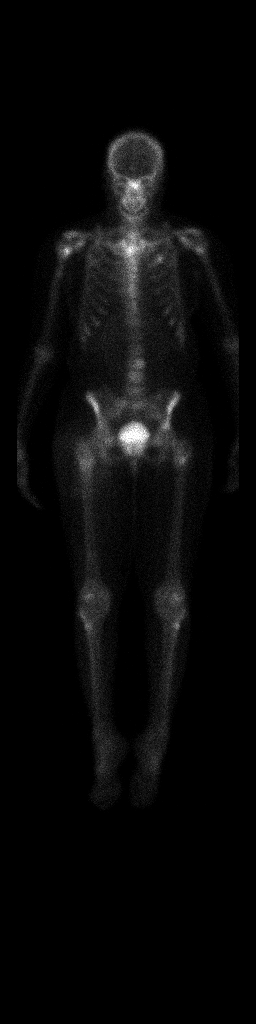
[frame 2/2]
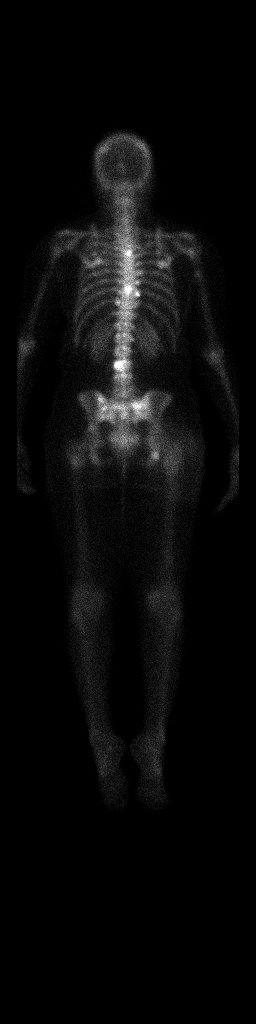

[Series 1000: statics · 2.40mm/px · 4 acquisitions, 8 frames shown]
[im 1/4]
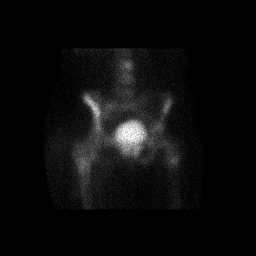
[im 1/4]
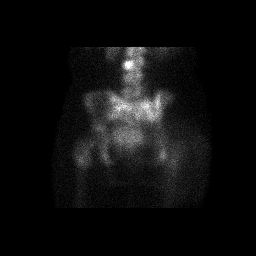
[im 2/4]
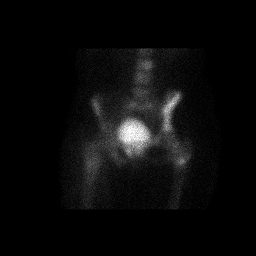
[im 2/4]
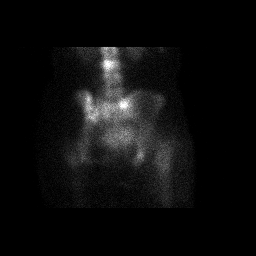
[im 3/4]
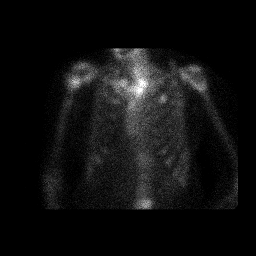
[im 3/4]
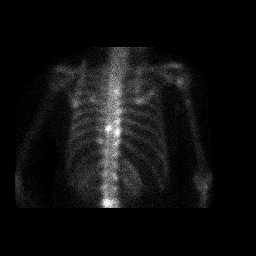
[im 4/4]
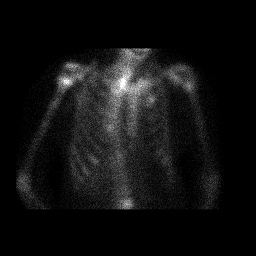
[im 4/4]
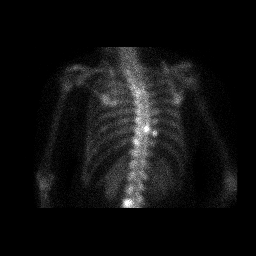

[10 of 10 positions shown; findings below may reference images not displayed]

FINDINGS: There is adequate uptake of the radiopharmaceutical by the skeleton.
Adequate soft tissue clearance and renal activity is demonstrated.

Uptake over the calvarium and cervical spine is normal. There is
stable increased uptake in the right ninth and left tenth
costovertebral joints. Increased uptake in the body of T8 is less
conspicuous today. There is mildly increased uptake in the body of
L3 which is new since the previous study. There is new increased
uptake in the anterior aspect of the left second rib.

There is persistent increased uptake in the medial aspect of the
right humeral head. There is mildly increased uptake in the left
humeral neck today which is new.

There is increased uptake in the right sacral ala more conspicuous
than in the past. There is a chronically increased uptake in the
lateral aspect of the right inferior pubic ramus. There is increased
uptake in the intertrochanteric region of the right femur which is
less conspicuous than on the previous study. There is focal
increased uptake in the intertrochanteric region of the left femur
which is more conspicuous than in the past. Small amounts of
increased uptake about the knees are consistent with degenerative
change.
IMPRESSION: 1. There is new mildly increased uptake in the body of L3, anterior
aspect of the left second rib, and left humeral neck. Activity in
the right sacral ala is more prominent than in the past. There is
increased uptake in the intertrochanteric region of the left femur
more conspicuous than in the past. These findings are worrisome for
progressive metastatic disease. Plain films of the lumbar spine and
proximal left humerus are recommended.
2. There are other areas of increased uptake not greatly changed
since the previous study. Activity in the proximal right femur is
less conspicuous today but given the patient's worsening symptoms,
plain films of this region are recommended.

## 2017-01-04 IMAGING — CT CT HEAD W/O CM
1 series · 15 of 30 positions shown, 19 images · non-contrast
Comparison: MRI 06/09/2014

CLINICAL DATA: 59-year-old female with a history of vertigo. Known
small cell carcinoma.

EXAM:
CT HEAD WITHOUT CONTRAST
TECHNIQUE: Contiguous axial images were obtained from the base of the skull
through the vertex without intravenous contrast.

[Series 2: head wo · axial · 0.40mm/px · z∈[-177,-51]mm · 15 of 32 slices shown, 19 images]
[im 2/32  brain]
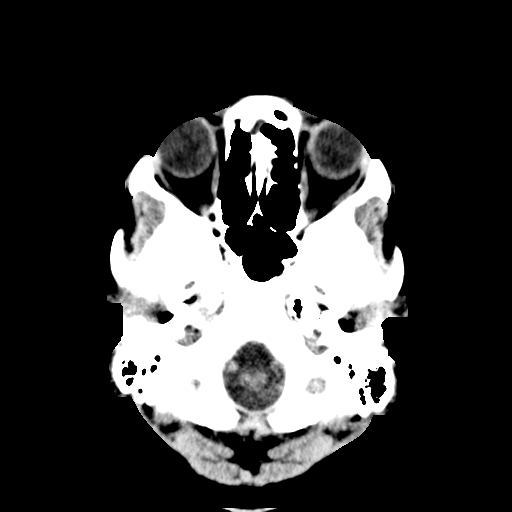
[im 2/32  bone]
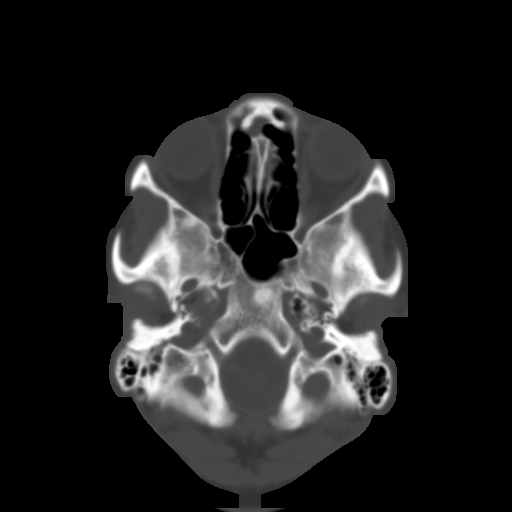
[im 4/32  brain]
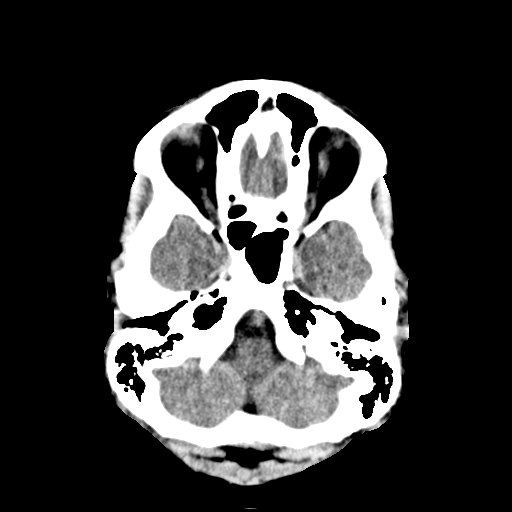
[im 6/32  brain]
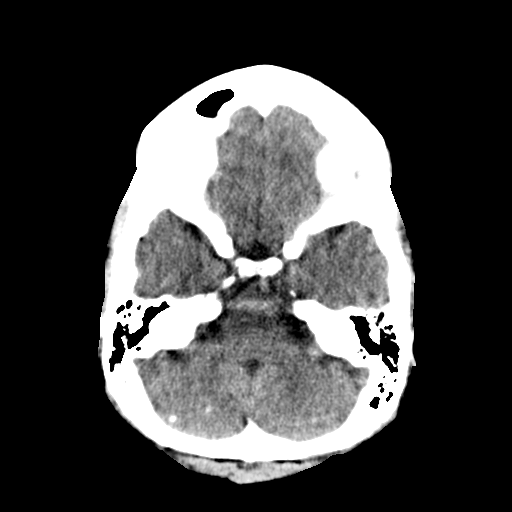
[im 8/32  brain]
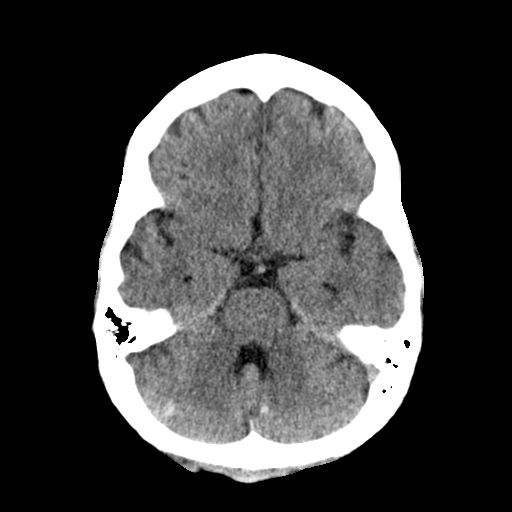
[im 10/32  brain]
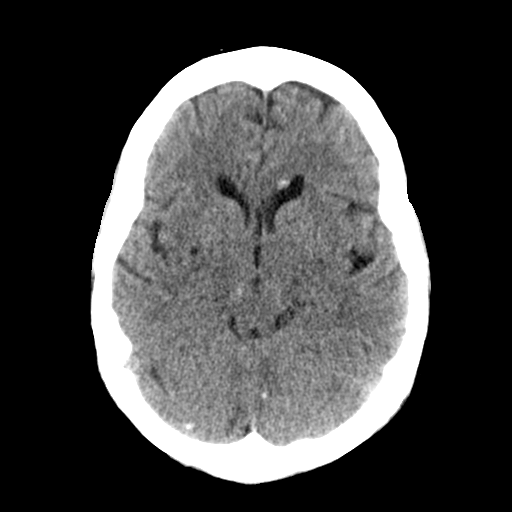
[im 10/32  bone]
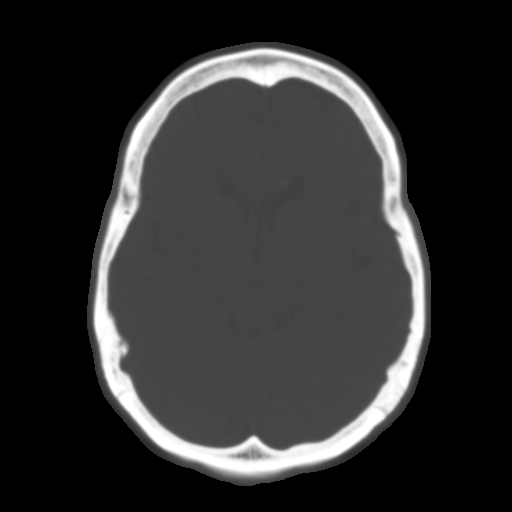
[im 12/32  brain]
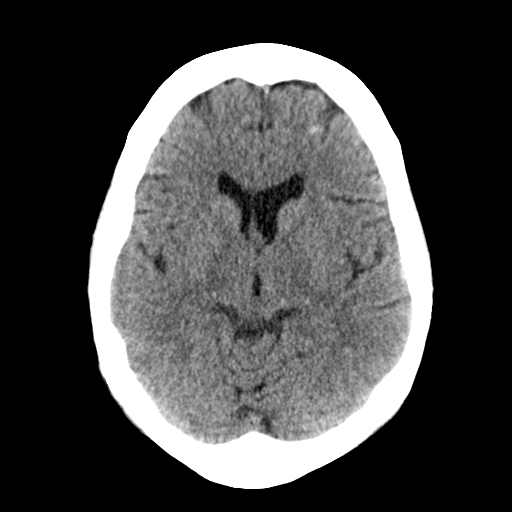
[im 14/32  brain]
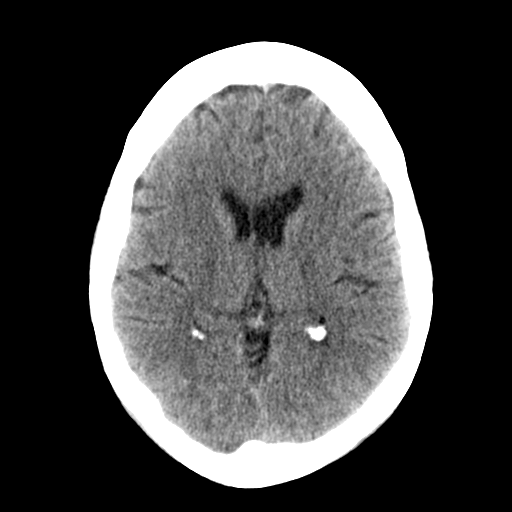
[im 17/32  brain]
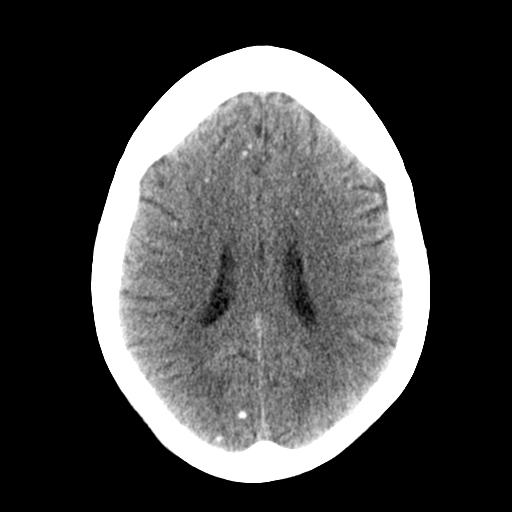
[im 18/32  brain]
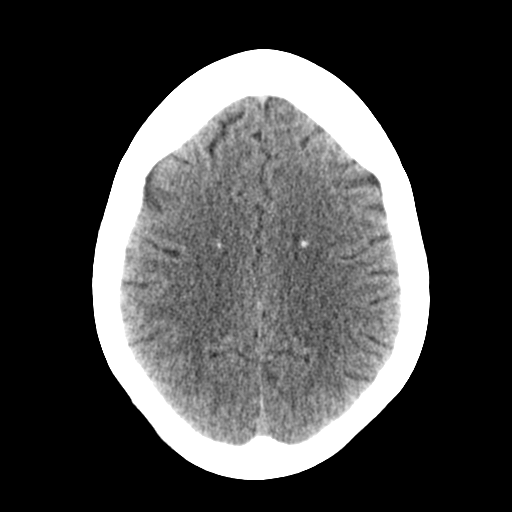
[im 18/32  bone]
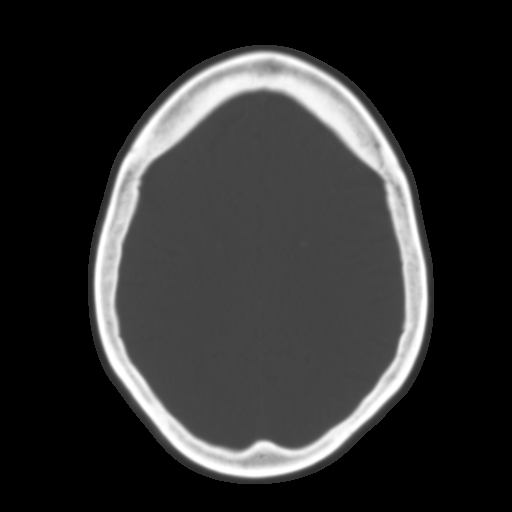
[im 20/32  brain]
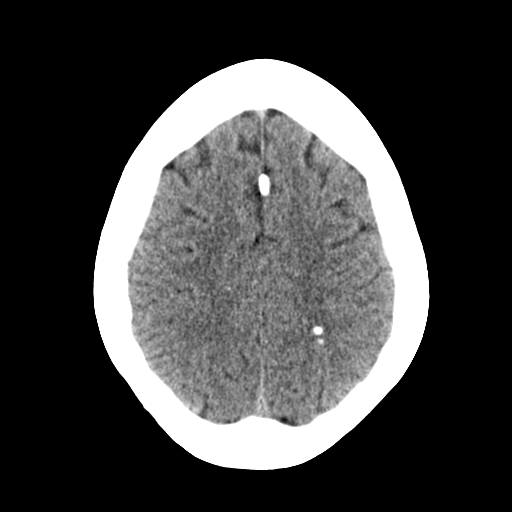
[im 22/32  brain]
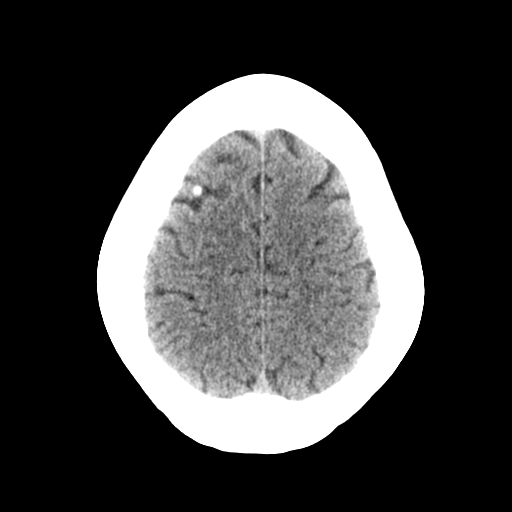
[im 24/32  brain]
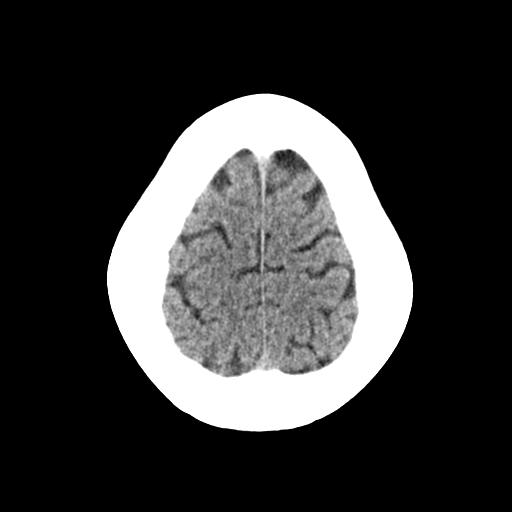
[im 26/32  brain]
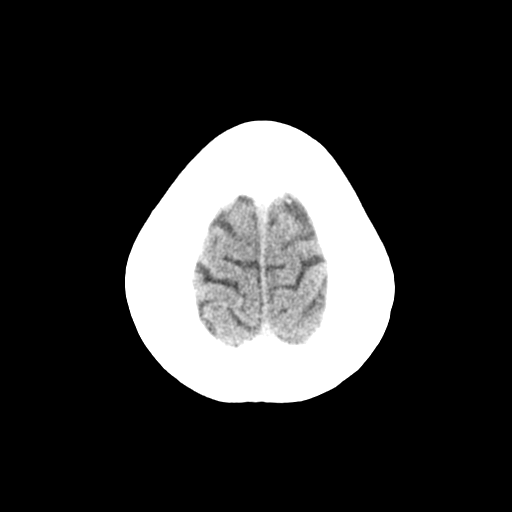
[im 26/32  bone]
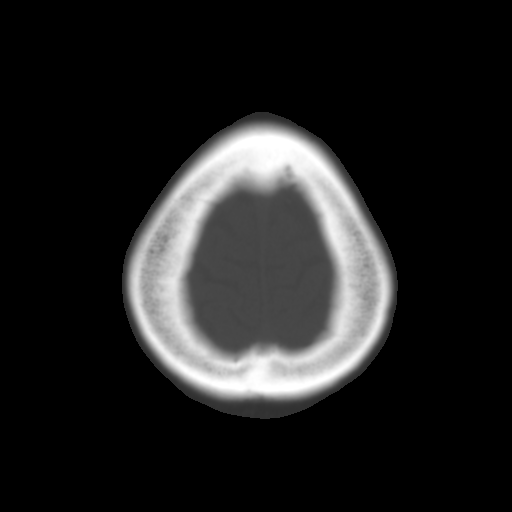
[im 28/32  brain]
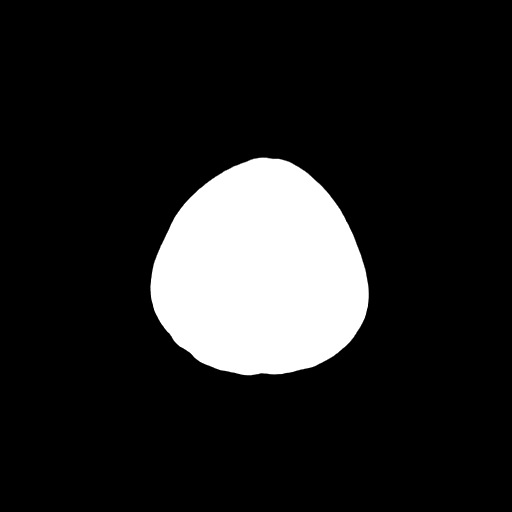
[im 30/32  brain]
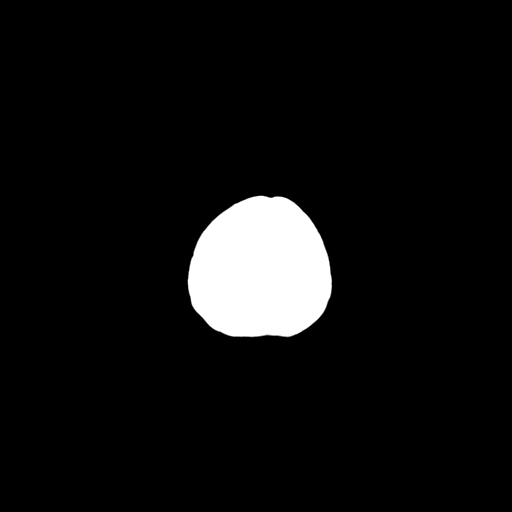

[15 of 30 positions shown; findings below may reference images not displayed]

FINDINGS: No fracture identified. Sclerotic focus at the clivus measuring 7
mm. It is questionable sclerotic focus of the right sphenoid.

Unremarkable appearance of the scalp soft tissues.

Unremarkable appearance of the bilateral orbits.

Mastoid air cells are clear.

No significant paranasal sinus disease

As compared to prior MRI study there has been interval development
of multiple hyperdense foci throughout all lobes of the bilateral
hemispheres, as well as the bilateral cerebellum. Largest focus in
the right cerebellum measures 7 mm. Largest supratentorial focus is
in the right frontal lobe measuring approximately 5 mm. No midline
shift.

No acute intracranial hemorrhage.

Unremarkable configuration of the ventricular system.

Gray-white differentiation is relatively maintained without
significant cytotoxic edema.
IMPRESSION: Interval development of multiple hyperdense foci throughout the
supratentorial and infratentorial brain with the largest in the
right frontal lobe measuring 5 mm in the largest in the right
cerebellum measuring 7 mm. Findings are compatible with new brain
metastases given the patient's history. Further evaluation with MRI
is recommended.

No midline shift, significant mass effect, or significant cytotoxic
edema is present on the current study.

Sclerotic focus in the clivus as well as the right sphenoid,
suggesting calvarial metastases.

These results were called by telephone at the time of interpretation
on 05/15/2015 at [DATE] to Dr. EITER TRISTE , who verbally
acknowledged these results.
